# Patient Record
Sex: Female | Born: 1955
Health system: Southern US, Community
[De-identification: ages and names within clinical notes are randomized; demographics above are authoritative.]

## PROBLEM LIST (undated history)

## (undated) DIAGNOSIS — O3410 Maternal care for benign tumor of corpus uteri, unspecified trimester: Secondary | ICD-10-CM

## (undated) DIAGNOSIS — I10 Essential (primary) hypertension: Secondary | ICD-10-CM

## (undated) DIAGNOSIS — D259 Leiomyoma of uterus, unspecified: Secondary | ICD-10-CM

## (undated) DIAGNOSIS — D649 Anemia, unspecified: Secondary | ICD-10-CM

## (undated) DIAGNOSIS — E119 Type 2 diabetes mellitus without complications: Secondary | ICD-10-CM

## (undated) DIAGNOSIS — K219 Gastro-esophageal reflux disease without esophagitis: Secondary | ICD-10-CM

## (undated) DIAGNOSIS — K5792 Diverticulitis of intestine, part unspecified, without perforation or abscess without bleeding: Secondary | ICD-10-CM

## (undated) DIAGNOSIS — IMO0001 Reserved for inherently not codable concepts without codable children: Secondary | ICD-10-CM

## (undated) HISTORY — PX: HAMMER TOE SURGERY: SHX385

## (undated) HISTORY — PX: OTHER SURGICAL HISTORY: SHX169

## (undated) HISTORY — DX: Anemia, unspecified: D64.9

## (undated) HISTORY — PX: TONSILLECTOMY: SUR1361

---

## 2012-11-06 ENCOUNTER — Other Ambulatory Visit: Payer: Self-pay | Admitting: Family Medicine

## 2012-11-06 DIAGNOSIS — Z1231 Encounter for screening mammogram for malignant neoplasm of breast: Secondary | ICD-10-CM

## 2012-12-03 ENCOUNTER — Ambulatory Visit
Admission: RE | Admit: 2012-12-03 | Discharge: 2012-12-03 | Disposition: A | Discharge status: Home / Self Care | Payer: Federal, State, Local not specified - PPO | Source: Ambulatory Visit | Attending: Family Medicine | Admitting: Family Medicine

## 2012-12-03 DIAGNOSIS — Z1231 Encounter for screening mammogram for malignant neoplasm of breast: Secondary | ICD-10-CM

## 2012-12-10 ENCOUNTER — Other Ambulatory Visit: Payer: Self-pay | Admitting: Gastroenterology

## 2012-12-10 DIAGNOSIS — R1310 Dysphagia, unspecified: Secondary | ICD-10-CM

## 2012-12-17 ENCOUNTER — Other Ambulatory Visit: Payer: Federal, State, Local not specified - PPO

## 2012-12-24 ENCOUNTER — Ambulatory Visit
Admission: RE | Admit: 2012-12-24 | Discharge: 2012-12-24 | Disposition: A | Discharge status: Home / Self Care | Payer: Federal, State, Local not specified - PPO | Source: Ambulatory Visit | Attending: Gastroenterology | Admitting: Gastroenterology

## 2012-12-24 DIAGNOSIS — R1310 Dysphagia, unspecified: Secondary | ICD-10-CM

## 2013-02-09 ENCOUNTER — Encounter (HOSPITAL_BASED_OUTPATIENT_CLINIC_OR_DEPARTMENT_OTHER): Payer: Self-pay | Admitting: *Deleted

## 2013-02-09 ENCOUNTER — Emergency Department (HOSPITAL_BASED_OUTPATIENT_CLINIC_OR_DEPARTMENT_OTHER)
Admission: EM | Admit: 2013-02-09 | Discharge: 2013-02-09 | Disposition: A | Discharge status: Home / Self Care | Payer: Federal, State, Local not specified - PPO | Attending: Emergency Medicine | Admitting: Emergency Medicine

## 2013-02-09 DIAGNOSIS — Z79899 Other long term (current) drug therapy: Secondary | ICD-10-CM | POA: Insufficient documentation

## 2013-02-09 DIAGNOSIS — I10 Essential (primary) hypertension: Secondary | ICD-10-CM | POA: Insufficient documentation

## 2013-02-09 DIAGNOSIS — Z8711 Personal history of peptic ulcer disease: Secondary | ICD-10-CM | POA: Insufficient documentation

## 2013-02-09 DIAGNOSIS — Z8719 Personal history of other diseases of the digestive system: Secondary | ICD-10-CM | POA: Insufficient documentation

## 2013-02-09 DIAGNOSIS — M436 Torticollis: Secondary | ICD-10-CM | POA: Insufficient documentation

## 2013-02-09 DIAGNOSIS — M25519 Pain in unspecified shoulder: Secondary | ICD-10-CM | POA: Insufficient documentation

## 2013-02-09 DIAGNOSIS — E119 Type 2 diabetes mellitus without complications: Secondary | ICD-10-CM | POA: Insufficient documentation

## 2013-02-09 DIAGNOSIS — K219 Gastro-esophageal reflux disease without esophagitis: Secondary | ICD-10-CM | POA: Insufficient documentation

## 2013-02-09 DIAGNOSIS — R110 Nausea: Secondary | ICD-10-CM | POA: Insufficient documentation

## 2013-02-09 DIAGNOSIS — R109 Unspecified abdominal pain: Secondary | ICD-10-CM | POA: Insufficient documentation

## 2013-02-09 DIAGNOSIS — G44209 Tension-type headache, unspecified, not intractable: Secondary | ICD-10-CM | POA: Insufficient documentation

## 2013-02-09 HISTORY — DX: Gastro-esophageal reflux disease without esophagitis: K21.9

## 2013-02-09 HISTORY — DX: Type 2 diabetes mellitus without complications: E11.9

## 2013-02-09 HISTORY — DX: Essential (primary) hypertension: I10

## 2013-02-09 HISTORY — DX: Reserved for inherently not codable concepts without codable children: IMO0001

## 2013-02-09 HISTORY — DX: Diverticulitis of intestine, part unspecified, without perforation or abscess without bleeding: K57.92

## 2013-02-09 LAB — URINE MICROSCOPIC-ADD ON

## 2013-02-09 LAB — CBC WITH DIFFERENTIAL/PLATELET
Basophils Absolute: 0 10*3/uL (ref 0.0–0.1)
Basophils Relative: 0 % (ref 0–1)
Eosinophils Absolute: 0.1 10*3/uL (ref 0.0–0.7)
Eosinophils Relative: 1 % (ref 0–5)
HCT: 39.2 % (ref 36.0–46.0)
Hemoglobin: 13.6 g/dL (ref 12.0–15.0)
Lymphocytes Relative: 32 % (ref 12–46)
Lymphs Abs: 1.7 10*3/uL (ref 0.7–4.0)
MCH: 31.9 pg (ref 26.0–34.0)
MCHC: 34.7 g/dL (ref 30.0–36.0)
MCV: 92 fL (ref 78.0–100.0)
Monocytes Absolute: 0.3 10*3/uL (ref 0.1–1.0)
Monocytes Relative: 7 % (ref 3–12)
Neutro Abs: 3.1 10*3/uL (ref 1.7–7.7)
Neutrophils Relative %: 60 % (ref 43–77)
Platelets: 252 10*3/uL (ref 150–400)
RBC: 4.26 MIL/uL (ref 3.87–5.11)
RDW: 13.4 % (ref 11.5–15.5)
WBC: 5.2 10*3/uL (ref 4.0–10.5)

## 2013-02-09 LAB — COMPREHENSIVE METABOLIC PANEL
ALT: 20 U/L (ref 0–35)
AST: 23 U/L (ref 0–37)
Albumin: 4.2 g/dL (ref 3.5–5.2)
Alkaline Phosphatase: 101 U/L (ref 39–117)
BUN: 15 mg/dL (ref 6–23)
CO2: 27 mEq/L (ref 19–32)
Calcium: 10.8 mg/dL — ABNORMAL HIGH (ref 8.4–10.5)
Chloride: 100 mEq/L (ref 96–112)
Creatinine, Ser: 0.8 mg/dL (ref 0.50–1.10)
GFR calc Af Amer: >90 mL/min (ref >90)
GFR calc non Af Amer: 81 mL/min — ABNORMAL LOW (ref >90)
Glucose, Bld: 73 mg/dL (ref 70–99)
Potassium: 3.6 mEq/L (ref 3.5–5.1)
Sodium: 139 mEq/L (ref 135–145)
Total Bilirubin: 0.3 mg/dL (ref 0.3–1.2)
Total Protein: 8.3 g/dL (ref 6.0–8.3)

## 2013-02-09 LAB — URINALYSIS, ROUTINE W REFLEX MICROSCOPIC
Bilirubin Urine: NEGATIVE
Glucose, UA: NEGATIVE mg/dL
Ketones, ur: NEGATIVE mg/dL
Nitrite: NEGATIVE
Protein, ur: NEGATIVE mg/dL
Specific Gravity, Urine: 1.014 (ref 1.005–1.030)
Urobilinogen, UA: 0.2 mg/dL (ref 0.0–1.0)
pH: 6 (ref 5.0–8.0)

## 2013-02-09 LAB — GLUCOSE, CAPILLARY: Glucose-Capillary: 74 mg/dL (ref 70–99)

## 2013-02-09 MED ORDER — SODIUM CHLORIDE 0.9 % IV BOLUS (SEPSIS)
1000.0000 mL | Freq: Once | INTRAVENOUS | Status: AC
  Administered 2013-02-09: 1000 mL via INTRAVENOUS

## 2013-02-09 MED ORDER — ONDANSETRON HCL 4 MG/2ML IJ SOLN
4.0000 mg | Freq: Once | INTRAMUSCULAR | Status: AC
  Administered 2013-02-09: 4 mg via INTRAVENOUS
  Filled 2013-02-09: qty 2

## 2013-02-09 MED ORDER — KETOROLAC TROMETHAMINE 30 MG/ML IJ SOLN
30.0000 mg | Freq: Once | INTRAMUSCULAR | Status: AC
  Administered 2013-02-09: 30 mg via INTRAVENOUS
  Filled 2013-02-09: qty 1

## 2013-02-09 NOTE — ED Provider Notes (Signed)
History    This chart was scribed for Charles B. Bernette Mayers, MD by Quintella Reichert, ED scribe.  This patient was seen in room MH03/MH03 and the patient's care was started at 6:52 PM.   CSN: [id number]  Arrival date & time 02/09/13  1755      Chief Complaint  Patient presents with  . Headache     The history is provided by the patient. No language interpreter was used.    HPI Comments: Nichole Harris is a 57 y.o. female who presents to the Emergency Department complaining of moderate-onset abdominal discomfort that began yesterday afternoon, with accompanying gradual-onset, gradually-worsening moderate headache that began today.  Pt describes abdominal symptoms as nausea accompanied by a sensation of bloating and gassiness.  Symptoms are not affected by eating.  She denies emesis, fever, urinary symptoms, bowel symptoms, abdominal pain, appetite change or any other associated symptoms.  Pt reports h/o diverticulitis and stomach ulcers but reports that her present symptoms feel different.  She states she has been moving her bowels regularly.  Pt describes head pain as diffuse throughout her entire head.  She denies h/o regular headaches.     Past Medical History  Diagnosis Date  . Diabetes mellitus without complication   . Hypertension   . Reflux   . Diverticulitis     Past Surgical History  Procedure Laterality Date  . Ufe      History reviewed. No pertinent family history.  History  Substance Use Topics  . Smoking status: Never Smoker   . Smokeless tobacco: Not on file  . Alcohol Use: Yes    OB History   Grav Para Term Preterm Abortions TAB SAB Ect Mult Living                  Review of Systems A complete 10 system review of systems was obtained and all systems are negative except as noted in the HPI and PMH.    Allergies  Review of patient's allergies indicates no known allergies.  Home Medications   Current Outpatient Rx  Name  Route  Sig  Dispense  Refill   . OMEPRAZOLE PO   Oral   Take by mouth.           BP 166/97  Pulse 72  Temp(Src) 97.9 F (36.6 C) (Oral)  Resp 20  Ht 6' (1.829 m)  Wt 275 lb (124.739 kg)  BMI 37.29 kg/m2  SpO2 97%  Physical Exam  Nursing note and vitals reviewed. Constitutional: She is oriented to person, place, and time. She appears well-developed and well-nourished.  HENT:  Head: Normocephalic and atraumatic.  Eyes: EOM are normal. Pupils are equal, round, and reactive to light.  Neck: Normal range of motion. Neck supple.  Cardiovascular: Normal rate, normal heart sounds and intact distal pulses.   Pulmonary/Chest: Effort normal and breath sounds normal.  Abdominal: Bowel sounds are normal. She exhibits no distension. There is no tenderness.  Musculoskeletal: Normal range of motion. She exhibits tenderness (Cervical paraspinal and shoulder soft tissue tenderness). She exhibits no edema.  Neurological: She is alert and oriented to person, place, and time. She has normal strength. No cranial nerve deficit or sensory deficit.  Skin: Skin is warm and dry. No rash noted.  Psychiatric: She has a normal mood and affect.    ED Course  Procedures (including critical care time)  DIAGNOSTIC STUDIES: Oxygen Saturation is 97% on room air, normal by my interpretation.    COORDINATION OF CARE: 6:56 PM-Discussed  treatment plan which includes anti-emetics and labs with pt at bedside and pt agreed to plan.      Labs Reviewed  COMPREHENSIVE METABOLIC PANEL - Abnormal; Notable for the following:    Calcium 10.8 (*)    GFR calc non Af Amer 81 (*)    All other components within normal limits  URINALYSIS, ROUTINE W REFLEX MICROSCOPIC - Abnormal; Notable for the following:    Hgb urine dipstick MODERATE (*)    Leukocytes, UA TRACE (*)    All other components within normal limits  URINE MICROSCOPIC-ADD ON - Abnormal; Notable for the following:    Squamous Epithelial / LPF FEW (*)    Bacteria, UA MANY (*)    All  other components within normal limits  URINE CULTURE  GLUCOSE, CAPILLARY  CBC WITH DIFFERENTIAL   No results found.   1. Nausea   2. Tension headache       MDM  Pt feeling better, labs unremarkable. Headache and neck stiffness from tension headache, no concern for intracranial process such as meningitis or SAH. She has history of hiatal hernia and ?gastritis, followed by GI, already taking PPI. Advised follow up with GI if symptoms persist.       I personally performed the services described in this documentation, which was scribed in my presence. The recorded information has been reviewed and is accurate.     Charles B. Bernette Mayers, MD 02/09/13 2109

## 2013-02-09 NOTE — ED Notes (Signed)
Pt states she has been "feeling bad" since Friday. Started off with "gas" and abd discomfort, but today has H/A and nausea. UTI 2 weeks ago.

## 2013-02-11 LAB — URINE CULTURE: Colony Count: 75000

## 2013-11-15 ENCOUNTER — Other Ambulatory Visit: Payer: Self-pay | Admitting: Obstetrics and Gynecology

## 2013-11-15 DIAGNOSIS — Z1231 Encounter for screening mammogram for malignant neoplasm of breast: Secondary | ICD-10-CM

## 2013-12-04 ENCOUNTER — Ambulatory Visit: Payer: Federal, State, Local not specified - PPO

## 2013-12-09 ENCOUNTER — Other Ambulatory Visit: Payer: Self-pay | Admitting: Family Medicine

## 2013-12-09 ENCOUNTER — Ambulatory Visit
Admission: RE | Admit: 2013-12-09 | Discharge: 2013-12-09 | Disposition: A | Discharge status: Home / Self Care | Payer: Federal, State, Local not specified - PPO | Source: Ambulatory Visit | Attending: Obstetrics and Gynecology | Admitting: Obstetrics and Gynecology

## 2013-12-09 ENCOUNTER — Ambulatory Visit
Admission: RE | Admit: 2013-12-09 | Discharge: 2013-12-09 | Disposition: A | Discharge status: Home / Self Care | Payer: Federal, State, Local not specified - PPO | Source: Ambulatory Visit | Attending: Family Medicine | Admitting: Family Medicine

## 2013-12-09 DIAGNOSIS — M79602 Pain in left arm: Secondary | ICD-10-CM

## 2013-12-09 DIAGNOSIS — Z1231 Encounter for screening mammogram for malignant neoplasm of breast: Secondary | ICD-10-CM

## 2013-12-11 ENCOUNTER — Other Ambulatory Visit: Payer: Self-pay | Admitting: Family Medicine

## 2013-12-11 ENCOUNTER — Ambulatory Visit
Admission: RE | Admit: 2013-12-11 | Discharge: 2013-12-11 | Disposition: A | Discharge status: Home / Self Care | Payer: Federal, State, Local not specified - PPO | Source: Ambulatory Visit | Attending: Family Medicine | Admitting: Family Medicine

## 2013-12-11 DIAGNOSIS — R079 Chest pain, unspecified: Secondary | ICD-10-CM

## 2013-12-13 ENCOUNTER — Other Ambulatory Visit: Payer: Self-pay | Admitting: Family Medicine

## 2013-12-13 DIAGNOSIS — R209 Unspecified disturbances of skin sensation: Secondary | ICD-10-CM

## 2013-12-19 ENCOUNTER — Other Ambulatory Visit: Payer: Federal, State, Local not specified - PPO

## 2013-12-21 ENCOUNTER — Ambulatory Visit
Admission: RE | Admit: 2013-12-21 | Discharge: 2013-12-21 | Disposition: A | Discharge status: Home / Self Care | Payer: Federal, State, Local not specified - PPO | Source: Ambulatory Visit | Attending: Family Medicine | Admitting: Family Medicine

## 2013-12-21 DIAGNOSIS — R209 Unspecified disturbances of skin sensation: Secondary | ICD-10-CM

## 2014-01-01 ENCOUNTER — Encounter: Payer: Self-pay | Admitting: Cardiovascular Disease

## 2014-01-01 DIAGNOSIS — E119 Type 2 diabetes mellitus without complications: Secondary | ICD-10-CM | POA: Insufficient documentation

## 2014-01-01 DIAGNOSIS — K219 Gastro-esophageal reflux disease without esophagitis: Secondary | ICD-10-CM

## 2014-01-01 DIAGNOSIS — K5792 Diverticulitis of intestine, part unspecified, without perforation or abscess without bleeding: Secondary | ICD-10-CM | POA: Insufficient documentation

## 2014-01-01 DIAGNOSIS — IMO0001 Reserved for inherently not codable concepts without codable children: Secondary | ICD-10-CM | POA: Insufficient documentation

## 2014-01-01 DIAGNOSIS — I10 Essential (primary) hypertension: Secondary | ICD-10-CM | POA: Insufficient documentation

## 2014-01-03 ENCOUNTER — Encounter: Payer: Self-pay | Admitting: Cardiovascular Disease

## 2014-01-03 ENCOUNTER — Ambulatory Visit (INDEPENDENT_AMBULATORY_CARE_PROVIDER_SITE_OTHER): Payer: Federal, State, Local not specified - PPO | Admitting: Cardiovascular Disease

## 2014-01-03 VITALS — BP 153/83 | HR 84 | Ht 72.0 in | Wt 290.0 lb

## 2014-01-03 DIAGNOSIS — K219 Gastro-esophageal reflux disease without esophagitis: Secondary | ICD-10-CM

## 2014-01-03 DIAGNOSIS — Z01810 Encounter for preprocedural cardiovascular examination: Secondary | ICD-10-CM

## 2014-01-03 DIAGNOSIS — E119 Type 2 diabetes mellitus without complications: Secondary | ICD-10-CM

## 2014-01-03 DIAGNOSIS — I10 Essential (primary) hypertension: Secondary | ICD-10-CM

## 2014-01-03 DIAGNOSIS — IMO0001 Reserved for inherently not codable concepts without codable children: Secondary | ICD-10-CM

## 2014-01-03 NOTE — Patient Instructions (Signed)
Your physician recommends that you schedule a follow-up appointment in: AS NEEDED  Your physician recommends that you continue on your current medications as directed. Please refer to the Current Medication list given to you today.  

## 2014-01-03 NOTE — Assessment & Plan Note (Signed)
Proton pump inhibiter  May be partially responsible for atypical symptoms  Weight loss and low carb diet

## 2014-01-03 NOTE — Progress Notes (Signed)
Patient ID: Nichole Harris, female   DOB: 22-Jul-1956, 58 y.o.   MRN: [id number]   58 yo obese black female referred by Dr Doran Durand for preop clearance  She needs right hammer toe surgery  She had uncomplicated surgery on her left foot a bit over a year ago.  She is obese with diabetes  She has chronic atypical chest and arm pain.  Pain is fleeting, sharp and nonexertional.  She also has mild to moderate reflux.  Thinks her left arm goes numb  And is swollen compared to the right  Has had mamogram and it was normal.  She is sedentary  She has taken her DM a bit more seriously lately with A1c 6.4 back on metformin  Indicates having normal myovue 3 or so years ago.  No history of connective tissue disease or arthritis       ROS: Denies fever, malais, weight loss, blurry vision, decreased visual acuity, cough, sputum, SOB, hemoptysis, pleuritic pain, palpitaitons, heartburn, abdominal pain, melena, lower extremity edema, claudication, or rash.  All other systems reviewed and negative   General: Affect appropriate Healthy:  appears stated age 13: normal  Tremor in lower jaw at times  Neck supple with no adenopathy JVP normal no bruits no thyromegaly Lungs clear with no wheezing and good diaphragmatic motion Heart:  S1/S2 no murmur,rub, gallop or click PMI normal Abdomen: benighn, BS positve, no tenderness, no AAA no bruit.  No HSM or HJR Distal pulses intact with no bruits No edema Neuro non-focal Skin warm and dry No muscular weakness  Medications Current Outpatient Prescriptions  Medication Sig Dispense Refill  . Esomeprazole Magnesium (NEXIUM PO) Take by mouth daily.      . metFORMIN (GLUCOPHAGE) 500 MG tablet Take 500 mg by mouth daily with breakfast.       No current facility-administered medications for this visit.    Allergies Review of patient's allergies indicates no known allergies.  Family History: No family history on file.  Social History: History   Social  History  . Marital Status: Married    Spouse Name: N/A    Number of Children: N/A  . Years of Education: N/A   Occupational History  . Not on file.   Social History Main Topics  . Smoking status: Never Smoker   . Smokeless tobacco: Not on file  . Alcohol Use: Yes  . Drug Use: No  . Sexual Activity: Yes    Birth Control/ Protection: None   Other Topics Concern  . Not on file   Social History Narrative  . No narrative on file    Electrocardiogram:  SR rate 80 poor R wave progression from body habitus  Assessment and Plan

## 2014-01-03 NOTE — Assessment & Plan Note (Signed)
Well controlled.  Continue current medications and low sodium Dash type diet.    

## 2014-01-03 NOTE — Assessment & Plan Note (Signed)
Discussed low carb diet.  Target hemoglobin A1c is 6.5 or less.  Continue current medications.  

## 2014-01-03 NOTE — Assessment & Plan Note (Signed)
He pains are totally atypical and non cardiac  ECG is normal for body habitus  Has had normal stress test in past and uncomplicated foot surgery in past  No need for further testing Clear to have right hammer toe surgery

## 2014-02-03 ENCOUNTER — Ambulatory Visit: Payer: Federal, State, Local not specified - PPO | Admitting: Interventional Cardiology

## 2014-04-22 ENCOUNTER — Ambulatory Visit
Admission: RE | Admit: 2014-04-22 | Discharge: 2014-04-22 | Disposition: A | Discharge status: Home / Self Care | Payer: Federal, State, Local not specified - PPO | Source: Ambulatory Visit | Attending: Family Medicine | Admitting: Family Medicine

## 2014-04-22 ENCOUNTER — Other Ambulatory Visit: Payer: Self-pay | Admitting: Family Medicine

## 2014-04-22 DIAGNOSIS — M545 Low back pain, unspecified: Secondary | ICD-10-CM

## 2014-04-24 ENCOUNTER — Other Ambulatory Visit: Payer: Self-pay | Admitting: Family Medicine

## 2014-04-24 DIAGNOSIS — M5137 Other intervertebral disc degeneration, lumbosacral region: Secondary | ICD-10-CM

## 2014-05-03 ENCOUNTER — Ambulatory Visit
Admission: RE | Admit: 2014-05-03 | Discharge: 2014-05-03 | Disposition: A | Discharge status: Home / Self Care | Payer: Federal, State, Local not specified - PPO | Source: Ambulatory Visit | Attending: Family Medicine | Admitting: Family Medicine

## 2014-05-03 DIAGNOSIS — M5137 Other intervertebral disc degeneration, lumbosacral region: Secondary | ICD-10-CM

## 2014-10-29 ENCOUNTER — Ambulatory Visit: Payer: Self-pay | Admitting: Podiatry

## 2014-11-13 ENCOUNTER — Other Ambulatory Visit: Payer: Self-pay

## 2014-11-13 DIAGNOSIS — Z1231 Encounter for screening mammogram for malignant neoplasm of breast: Secondary | ICD-10-CM

## 2014-11-25 ENCOUNTER — Other Ambulatory Visit: Payer: Self-pay | Admitting: Nurse Practitioner

## 2014-11-25 ENCOUNTER — Other Ambulatory Visit (HOSPITAL_COMMUNITY)
Admission: RE | Admit: 2014-11-25 | Discharge: 2014-11-25 | Disposition: A | Discharge status: Home / Self Care | Payer: Federal, State, Local not specified - PPO | Source: Ambulatory Visit | Attending: Nurse Practitioner | Admitting: Nurse Practitioner

## 2014-11-25 DIAGNOSIS — Z1151 Encounter for screening for human papillomavirus (HPV): Secondary | ICD-10-CM | POA: Diagnosis present

## 2014-11-25 DIAGNOSIS — Z01419 Encounter for gynecological examination (general) (routine) without abnormal findings: Secondary | ICD-10-CM | POA: Insufficient documentation

## 2014-11-27 LAB — CYTOLOGY - PAP

## 2014-12-17 ENCOUNTER — Encounter (INDEPENDENT_AMBULATORY_CARE_PROVIDER_SITE_OTHER): Payer: Self-pay

## 2014-12-17 ENCOUNTER — Ambulatory Visit
Admission: RE | Admit: 2014-12-17 | Discharge: 2014-12-17 | Disposition: A | Discharge status: Home / Self Care | Payer: Federal, State, Local not specified - PPO | Source: Ambulatory Visit

## 2014-12-17 DIAGNOSIS — Z1231 Encounter for screening mammogram for malignant neoplasm of breast: Secondary | ICD-10-CM

## 2015-04-16 ENCOUNTER — Emergency Department (HOSPITAL_BASED_OUTPATIENT_CLINIC_OR_DEPARTMENT_OTHER)
Admission: EM | Admit: 2015-04-16 | Discharge: 2015-04-16 | Disposition: A | Discharge status: Home / Self Care | Payer: Federal, State, Local not specified - PPO | Attending: Emergency Medicine | Admitting: Emergency Medicine

## 2015-04-16 ENCOUNTER — Emergency Department (HOSPITAL_BASED_OUTPATIENT_CLINIC_OR_DEPARTMENT_OTHER): Payer: Federal, State, Local not specified - PPO

## 2015-04-16 ENCOUNTER — Other Ambulatory Visit: Payer: Self-pay

## 2015-04-16 ENCOUNTER — Encounter (HOSPITAL_BASED_OUTPATIENT_CLINIC_OR_DEPARTMENT_OTHER): Payer: Self-pay

## 2015-04-16 DIAGNOSIS — K219 Gastro-esophageal reflux disease without esophagitis: Secondary | ICD-10-CM | POA: Insufficient documentation

## 2015-04-16 DIAGNOSIS — R109 Unspecified abdominal pain: Secondary | ICD-10-CM | POA: Diagnosis not present

## 2015-04-16 DIAGNOSIS — M546 Pain in thoracic spine: Secondary | ICD-10-CM | POA: Diagnosis present

## 2015-04-16 DIAGNOSIS — I10 Essential (primary) hypertension: Secondary | ICD-10-CM | POA: Diagnosis not present

## 2015-04-16 DIAGNOSIS — R0789 Other chest pain: Secondary | ICD-10-CM | POA: Insufficient documentation

## 2015-04-16 DIAGNOSIS — E119 Type 2 diabetes mellitus without complications: Secondary | ICD-10-CM | POA: Diagnosis not present

## 2015-04-16 DIAGNOSIS — R197 Diarrhea, unspecified: Secondary | ICD-10-CM | POA: Insufficient documentation

## 2015-04-16 DIAGNOSIS — R110 Nausea: Secondary | ICD-10-CM | POA: Insufficient documentation

## 2015-04-16 DIAGNOSIS — Z79899 Other long term (current) drug therapy: Secondary | ICD-10-CM | POA: Insufficient documentation

## 2015-04-16 DIAGNOSIS — M62830 Muscle spasm of back: Secondary | ICD-10-CM

## 2015-04-16 HISTORY — DX: Leiomyoma of uterus, unspecified: D25.9

## 2015-04-16 HISTORY — DX: Maternal care for benign tumor of corpus uteri, unspecified trimester: O34.10

## 2015-04-16 LAB — CBC WITH DIFFERENTIAL/PLATELET
Basophils Absolute: 0 10*3/uL (ref 0.0–0.1)
Basophils Relative: 0 % (ref 0–1)
Eosinophils Absolute: 0 10*3/uL (ref 0.0–0.7)
Eosinophils Relative: 1 % (ref 0–5)
HCT: 39.7 % (ref 36.0–46.0)
Hemoglobin: 13.1 g/dL (ref 12.0–15.0)
Lymphocytes Relative: 39 % (ref 12–46)
Lymphs Abs: 1.3 10*3/uL (ref 0.7–4.0)
MCH: 30 pg (ref 26.0–34.0)
MCHC: 33 g/dL (ref 30.0–36.0)
MCV: 90.8 fL (ref 78.0–100.0)
Monocytes Absolute: 0.3 10*3/uL (ref 0.1–1.0)
Monocytes Relative: 8 % (ref 3–12)
Neutro Abs: 1.7 10*3/uL (ref 1.7–7.7)
Neutrophils Relative %: 52 % (ref 43–77)
Platelets: 264 10*3/uL (ref 150–400)
RBC: 4.37 MIL/uL (ref 3.87–5.11)
RDW: 14.4 % (ref 11.5–15.5)
WBC: 3.4 10*3/uL — ABNORMAL LOW (ref 4.0–10.5)

## 2015-04-16 LAB — URINALYSIS, DIPSTICK ONLY
Bilirubin Urine: NEGATIVE
Glucose, UA: NEGATIVE mg/dL
Hgb urine dipstick: NEGATIVE
Ketones, ur: NEGATIVE mg/dL
Leukocytes, UA: NEGATIVE
Nitrite: NEGATIVE
Protein, ur: NEGATIVE mg/dL
Specific Gravity, Urine: 1.011 (ref 1.005–1.030)
Urobilinogen, UA: 0.2 mg/dL (ref 0.0–1.0)
pH: 6 (ref 5.0–8.0)

## 2015-04-16 LAB — COMPREHENSIVE METABOLIC PANEL
ALT: 14 U/L (ref 14–54)
AST: 19 U/L (ref 15–41)
Albumin: 3.9 g/dL (ref 3.5–5.0)
Alkaline Phosphatase: 98 U/L (ref 38–126)
Anion gap: 8 (ref 5–15)
BUN: 9 mg/dL (ref 6–20)
CO2: 27 mmol/L (ref 22–32)
Calcium: 10 mg/dL (ref 8.9–10.3)
Chloride: 104 mmol/L (ref 101–111)
Creatinine, Ser: 0.69 mg/dL (ref 0.44–1.00)
GFR calc Af Amer: >60 mL/min (ref >60)
GFR calc non Af Amer: >60 mL/min (ref >60)
Glucose, Bld: 111 mg/dL — ABNORMAL HIGH (ref 65–99)
Potassium: 3.9 mmol/L (ref 3.5–5.1)
Sodium: 139 mmol/L (ref 135–145)
Total Bilirubin: 0.5 mg/dL (ref 0.3–1.2)
Total Protein: 7.6 g/dL (ref 6.5–8.1)

## 2015-04-16 LAB — TROPONIN I: Troponin I: <0.03 ng/mL (ref <0.031)

## 2015-04-16 MED ORDER — OXYCODONE-ACETAMINOPHEN 10-325 MG PO TABS: 1.0000 | ORAL_TABLET | ORAL | Status: DC | PRN

## 2015-04-16 MED ORDER — MORPHINE SULFATE 4 MG/ML IJ SOLN
4.0000 mg | Freq: Once | INTRAMUSCULAR | Status: AC
  Administered 2015-04-16: 4 mg via INTRAVENOUS
  Filled 2015-04-16: qty 1

## 2015-04-16 MED ORDER — CYCLOBENZAPRINE HCL 10 MG PO TABS
5.0000 mg | ORAL_TABLET | Freq: Once | ORAL | Status: AC
  Administered 2015-04-16: 5 mg via ORAL
  Filled 2015-04-16: qty 1

## 2015-04-16 MED ORDER — ONDANSETRON HCL 4 MG/2ML IJ SOLN
4.0000 mg | Freq: Once | INTRAMUSCULAR | Status: AC
Start: 2015-04-16 — End: 2015-04-16
  Administered 2015-04-16: 4 mg via INTRAVENOUS
  Filled 2015-04-16: qty 2

## 2015-04-16 MED ORDER — CYCLOBENZAPRINE HCL 10 MG PO TABS: 10.0000 mg | ORAL_TABLET | Freq: Three times a day (TID) | ORAL | Status: DC | PRN

## 2015-04-16 NOTE — ED Notes (Signed)
Back pain that started yesterday.  Denies injury.  States she has experienced similar pain x 4 in the past. Pain worse this am with movement and palpation left flank area.

## 2015-04-16 NOTE — ED Notes (Signed)
MD at bedside. 

## 2015-04-16 NOTE — ED Provider Notes (Signed)
CSN: [id number]     Arrival date & time 04/16/15  0654 History   First MD Initiated Contact with Patient 04/16/15 [phone]     Chief Complaint  Patient presents with  . Back Pain     (Consider location/radiation/quality/duration/timing/severity/associated sxs/prior Treatment) HPI Comments: Pt. Is a 59 y/o AAF with hx of DMII, and Untreated HTN as well as Diverticulitis presenting today with acute onset low thoracic / upper lumbar back pain. This started early yesterday evening when she was sitting in her living room watching television. She says that the pain came on suddenly and radiates around her left side. The pain is better with favoring her right side while seated. Pain is severe. She has some nausea due to the pain. She has not had any numbness, tingling, bowel or bladder incontinence. She says she has not been lifting anything heavy recently, nor doing any new exercises / activities. She denies fevers, chills, recent illnesses, hematuria, dysuria. She has not had any constipation. She had one loose stool this am. She has not had any rashes. She has not been around anyone who has been sick. She has no had SOB, neck pain, jaw pain, arm pain. She HAS had this before over a year ago, but she says it resolved on its own without any intervention. This time the pain is worse, so she came in for evaluation.   The history is provided by the patient.    Past Medical History  Diagnosis Date  . Diabetes mellitus without complication   . Hypertension   . Reflux   . Diverticulitis   . Uterine fibroids affecting pregnancy    Past Surgical History  Procedure Laterality Date  . Ufe    . Hammer toe surgery    . Tonsillectomy    . Lumpectomy     No family history on file. History  Substance Use Topics  . Smoking status: Never Smoker   . Smokeless tobacco: Not on file  . Alcohol Use: Yes     Comment: opnce a month wine   OB History    No data available     Review of Systems    Constitutional: Positive for activity change. Negative for fever, chills, diaphoresis, appetite change, fatigue and unexpected weight change.  HENT: Negative.  Negative for congestion, rhinorrhea, sinus pressure and sore throat.   Eyes: Negative.  Negative for photophobia and visual disturbance.  Respiratory: Negative for cough, chest tightness, shortness of breath and wheezing.        Pleuritic pain  Cardiovascular: Negative for chest pain, palpitations and leg swelling.  Gastrointestinal: Positive for nausea and diarrhea. Negative for vomiting, abdominal pain, constipation, blood in stool and abdominal distention.  Endocrine: Negative.  Negative for heat intolerance, polydipsia and polyphagia.  Genitourinary: Positive for flank pain. Negative for dysuria, urgency, frequency, hematuria, decreased urine volume, difficulty urinating and pelvic pain.  Musculoskeletal: Positive for myalgias and back pain. Negative for joint swelling, arthralgias, gait problem, neck pain and neck stiffness.  Skin: Negative.  Negative for rash.  Allergic/Immunologic: Negative.   Neurological: Negative.  Negative for dizziness, syncope, speech difficulty, weakness, light-headedness, numbness and headaches.  Hematological: Negative.   Psychiatric/Behavioral: Negative.       Allergies  Review of patient's allergies indicates no known allergies.  Home Medications   Prior to Admission medications   Medication Sig Start Date End Date Taking? Authorizing Provider  cyclobenzaprine (FLEXERIL) 10 MG tablet Take 1 tablet (10 mg total) by mouth 3 (three) times daily  as needed for muscle spasms. 04/16/15   York Ram Gaje Tennyson, MD  Esomeprazole Magnesium (NEXIUM PO) Take by mouth daily.    Historical Provider, MD  metFORMIN (GLUCOPHAGE) 500 MG tablet Take 500 mg by mouth daily with breakfast.    Historical Provider, MD  oxyCODONE-acetaminophen (PERCOCET) 10-325 MG per tablet Take 1 tablet by mouth every 4 (four) hours as  needed for pain. 04/16/15   York Ram Emalene Welte, MD   BP 163/107 mmHg  Pulse 82  Temp(Src) 98 F (36.7 C) (Oral)  Resp 20  Ht 6' (1.829 m)  Wt 280 lb (127.007 kg)  BMI 37.97 kg/m2  SpO2 99% Physical Exam  Constitutional: She is oriented to person, place, and time. She appears well-developed and well-nourished. She appears distressed.  HENT:  Head: Normocephalic and atraumatic.  Eyes: Conjunctivae and EOM are normal. Pupils are equal, round, and reactive to light.  Neck: Normal range of motion. Neck supple.  Cardiovascular: Normal rate, regular rhythm, normal heart sounds and intact distal pulses.  Exam reveals no gallop and no friction rub.   No murmur heard. Pulmonary/Chest: Effort normal and breath sounds normal. No respiratory distress. She has no wheezes. She has no rales. She exhibits no tenderness.  Abdominal: Soft. Bowel sounds are normal. She exhibits no distension and no mass. There is no hepatosplenomegaly. There is no tenderness. There is no rigidity, no rebound, no guarding and no CVA tenderness.  Musculoskeletal: Normal range of motion. She exhibits tenderness. She exhibits no edema.       Thoracic back: She exhibits tenderness and pain. She exhibits no bony tenderness, no swelling, no edema, no deformity, no laceration and no spasm.       Back:  Lymphadenopathy:    She has no cervical adenopathy.  Neurological: She is alert and oriented to person, place, and time. No cranial nerve deficit. She exhibits normal muscle tone. Coordination normal.  Skin: Skin is warm and dry. No rash noted. She is not diaphoretic. No erythema.  Psychiatric: She has a normal mood and affect. Her behavior is normal.    ED Course  Procedures (including critical care time) Labs Review Labs Reviewed  COMPREHENSIVE METABOLIC PANEL - Abnormal; Notable for the following:    Glucose, Bld 111 (*)    All other components within normal limits  CBC WITH DIFFERENTIAL/PLATELET - Abnormal; Notable for the  following:    WBC 3.4 (*)    All other components within normal limits  TROPONIN I  URINALYSIS, DIPSTICK ONLY    Imaging Review Dg Chest 2 View  04/16/2015   CLINICAL DATA:  Left-sided posterior and axillary lower chest wall pain, history of diabetes and reflux, nonsmoker.  EXAM: CHEST  2 VIEW  COMPARISON:  PA and lateral chest x-ray of December 11, 2013  FINDINGS: The lungs are well-expanded and clear. The heart and mediastinal structures are normal. The trachea is midline. There is no pleural effusion. There is mild tortuosity of the descending thoracic aorta. The bony thorax exhibits no acute abnormality.  IMPRESSION: There is no active cardiopulmonary disease.   Electronically Signed   By: David  Martinique M.D.   On: 04/16/2015 08:42     EKG Interpretation   Date/Time:  Thursday April 16 2015 07:28:30 EDT Ventricular Rate:  68 PR Interval:  160 QRS Duration: 76 QT Interval:  378 QTC Calculation: 401 R Axis:   37 Text Interpretation:  Normal sinus rhythm Normal ECG Confirmed by KOHUT   MD, STEPHEN ([id number]) on 04/16/2015 7:44:22 AM  MDM   Final diagnoses:  Muscle spasm of back   59 y/o AAF with DMII, and HTN here with acute onset mid - low back pain. Differential includes muscle strain / spasm, kidney stone, pyelonephritis, diverticulitis, Pneumonia. Less likely, though possible would be PE (Well's equivocal) or MI. At this point will get basic lab work and U/A and proceed with further workup from there. Morphine and Flexeril for pain and muscle relaxation.   8:55 am: Workup Negative. Afebrile and without WBC elevation. U/A completely negative, and CXR without evidence of pneumonia. Vital signs stable. Pain improved with morphine and flexeril. Likely musculoskeletal at this point. Will discharge home with plan for close follow up with PCP. Return precautions given. Safe for discharge.      Aquilla Hacker, MD 04/16/15 [id number]  Virgel Manifold, MD 04/17/15 [phone]

## 2015-04-16 NOTE — ED Notes (Signed)
Pt returned from radiology.  No change in assessment.

## 2015-04-16 NOTE — Discharge Instructions (Signed)

## 2015-04-16 NOTE — ED Notes (Signed)
Pt calling ride (co-worker) for transport.  Discussing no driving vehicle considering pt received IV narcotics.  Pt verbalizes understanding and agrees to have someone drive her home.

## 2015-08-24 ENCOUNTER — Other Ambulatory Visit: Payer: Self-pay | Admitting: Gastroenterology

## 2015-08-24 DIAGNOSIS — R1310 Dysphagia, unspecified: Secondary | ICD-10-CM

## 2015-08-28 ENCOUNTER — Ambulatory Visit
Admission: RE | Admit: 2015-08-28 | Discharge: 2015-08-28 | Disposition: A | Discharge status: Home / Self Care | Payer: Federal, State, Local not specified - PPO | Source: Ambulatory Visit | Attending: Gastroenterology | Admitting: Gastroenterology

## 2015-08-28 DIAGNOSIS — R1310 Dysphagia, unspecified: Secondary | ICD-10-CM

## 2015-11-19 ENCOUNTER — Other Ambulatory Visit: Payer: Self-pay

## 2015-11-19 DIAGNOSIS — Z1231 Encounter for screening mammogram for malignant neoplasm of breast: Secondary | ICD-10-CM

## 2015-12-25 ENCOUNTER — Ambulatory Visit
Admission: RE | Admit: 2015-12-25 | Discharge: 2015-12-25 | Disposition: A | Discharge status: Home / Self Care | Payer: Federal, State, Local not specified - PPO | Source: Ambulatory Visit

## 2015-12-25 DIAGNOSIS — Z1231 Encounter for screening mammogram for malignant neoplasm of breast: Secondary | ICD-10-CM

## 2016-04-14 ENCOUNTER — Other Ambulatory Visit: Payer: Self-pay | Admitting: Surgery

## 2016-04-14 DIAGNOSIS — R252 Cramp and spasm: Secondary | ICD-10-CM

## 2016-05-16 ENCOUNTER — Encounter: Payer: Self-pay | Admitting: Surgery

## 2016-05-18 ENCOUNTER — Encounter: Payer: Self-pay | Admitting: Surgery

## 2016-05-18 ENCOUNTER — Ambulatory Visit (HOSPITAL_COMMUNITY)
Admission: RE | Admit: 2016-05-18 | Discharge: 2016-05-18 | Disposition: A | Discharge status: Home / Self Care | Payer: Federal, State, Local not specified - PPO | Source: Ambulatory Visit | Attending: Surgery | Admitting: Surgery

## 2016-05-18 ENCOUNTER — Ambulatory Visit (INDEPENDENT_AMBULATORY_CARE_PROVIDER_SITE_OTHER): Payer: Federal, State, Local not specified - PPO | Admitting: Surgery

## 2016-05-18 VITALS — BP 134/88 | HR 74 | Temp 97.1°F | Resp 18 | Ht 71.5 in | Wt 296.7 lb

## 2016-05-18 DIAGNOSIS — I10 Essential (primary) hypertension: Secondary | ICD-10-CM | POA: Insufficient documentation

## 2016-05-18 DIAGNOSIS — G8929 Other chronic pain: Secondary | ICD-10-CM

## 2016-05-18 DIAGNOSIS — K219 Gastro-esophageal reflux disease without esophagitis: Secondary | ICD-10-CM | POA: Diagnosis not present

## 2016-05-18 DIAGNOSIS — E119 Type 2 diabetes mellitus without complications: Secondary | ICD-10-CM | POA: Diagnosis not present

## 2016-05-18 DIAGNOSIS — R252 Cramp and spasm: Secondary | ICD-10-CM | POA: Insufficient documentation

## 2016-05-18 DIAGNOSIS — R0989 Other specified symptoms and signs involving the circulatory and respiratory systems: Secondary | ICD-10-CM | POA: Insufficient documentation

## 2016-05-18 DIAGNOSIS — M79606 Pain in leg, unspecified: Secondary | ICD-10-CM | POA: Diagnosis not present

## 2016-05-18 NOTE — Progress Notes (Signed)
[facility]  Patient name: Nichole Harris MRN: [id number] DOB: [birth year] Sex: female  REFERRING PHYSICIAN: Dr. Chapman Fitch  REASON FOR CONSULT: Leg cramps and sharpness, numbness  HPI: Nichole Harris is a 60 y.o. female, who is referred today for evaluation of leg pain.  The patient states that she has been having leg cramps, pain and numbness that has been getting progressively worse.  She states that she gets cramps in the middle the night and while she is sitting down.  She does not endorse cramping with activity.  She states that her mother and sister also have cramping.  She has been trying magnesium as of late and this has improved her symptoms somewhat.  She does not have any open wounds on her legs.  The patient is diabetic which has been controlled.  She is medically managed for hypertension.  She is a nonsmoker  Past Medical History:  Diagnosis Date  . Anemia   . Diabetes mellitus without complication ([place])   . Diverticulitis   . Hypertension   . Reflux   . Uterine fibroids affecting pregnancy     Family History  Problem Relation Age of Onset  . AAA (abdominal aortic aneurysm) Mother   . AAA (abdominal aortic aneurysm) Father   . Heart disease Father     SOCIAL HISTORY: Social History   Social History  . Marital status: Married    Spouse name: N/A  . Number of children: N/A  . Years of education: N/A   Occupational History  . Not on file.   Social History Main Topics  . Smoking status: Never Smoker  . Smokeless tobacco: Never Used  . Alcohol use Yes     Comment: opnce a month wine  . Drug use: No  . Sexual activity: Yes    Birth control/ protection: None   Other Topics Concern  . Not on file   Social History Narrative  . No narrative on file    No Known Allergies  Current Outpatient Prescriptions  Medication Sig Dispense Refill  . Esomeprazole Magnesium (NEXIUM PO) Take by mouth daily.     . Magnesium 250 MG TABS Take by mouth daily.    . metFORMIN (GLUCOPHAGE) 500 MG tablet Take 500 mg by mouth daily with breakfast.    . Multiple Vitamins-Minerals (MULTIVITAMIN ADULT PO) Take by mouth.    . cyclobenzaprine (FLEXERIL) 10 MG tablet Take 1 tablet (10 mg total) by mouth 3 (three) times daily as needed for muscle spasms. (Patient not taking: Reported on 05/18/2016) 30 tablet 0  . oxyCODONE-acetaminophen (PERCOCET) 10-325 MG per tablet Take 1 tablet by mouth every 4 (four) hours as needed for pain. (Patient not taking: Reported on 05/18/2016) 15 tablet 0   No current facility-administered medications for this visit.     REVIEW OF SYSTEMS:  [X]  denotes positive finding, [ ]  denotes negative finding Cardiac  Comments:  Chest pain or chest pressure:    Shortness of breath upon exertion:    Short of breath when lying flat:    Irregular heart rhythm:        Vascular    Pain in calf, thigh, or hip brought on by ambulation: x   Pain in feet at night that wakes you up from your sleep:  x   Blood clot in your veins:    Leg swelling:  x       Pulmonary    Oxygen at home:    Productive cough:  Wheezing:         Neurologic    Sudden weakness in arms or legs:  x   Sudden numbness in arms or legs:  x   Sudden onset of difficulty speaking or slurred speech:    Temporary loss of vision in one eye:     Problems with dizziness:         Gastrointestinal    Blood in stool:     Vomited blood:         Genitourinary    Burning when urinating:     Blood in urine:        Psychiatric    Major depression:         Hematologic    Bleeding problems:    Problems with blood clotting too easily:        Skin    Rashes or ulcers:        Constitutional    Fever or chills:      PHYSICAL EXAM: Vitals:   05/18/16 1226  BP: 134/88  Pulse: 74  Resp: 18  Temp: 97.1 F (36.2 C)  TempSrc: Oral  SpO2: 100%  Weight: 296 lb 11.2 oz (134.6 kg)  Height: 5' 11.5" (1.816 m)     GENERAL: The patient is a well-nourished female, in no acute distress. The vital signs are documented above. CARDIAC: There is a regular rate and rhythm.  VASCULAR: palpable pedal pulses PULMONARY: There is good air exchange bilaterally without wheezing or rales. MUSCULOSKELETAL: There are no major deformities or cyanosis. NEUROLOGIC: No focal weakness or paresthesias are detected. SKIN: There are no ulcers or rashes noted. PSYCHIATRIC: The patient has a normal affect.  DATA:  I have reviewed the patient's vascular lab studies.  ABIs 1.1 on the right and 1.07 left  ASSESSMENT AND PLAN: Leg cramps: I do not think that the patient is suffering from vascular insufficiency.  Despite being a diabetic, she has normal arterial Doppler studies down to the ankle.  Her cramping is most likely secondary to electrolyte imbalance and/or possibly dehydration.  I have encouraged her to stay well-hydrated and didn't improve for nutrition and dietary supplementation.  She'll follow-up with me on an as-needed basis.   Annamarie Major, MD [facility] [phone] Pager [phone]

## 2016-07-11 DIAGNOSIS — Z79899 Other long term (current) drug therapy: Secondary | ICD-10-CM | POA: Diagnosis not present

## 2016-07-14 ENCOUNTER — Other Ambulatory Visit: Payer: Self-pay | Admitting: Nurse Practitioner

## 2016-07-14 DIAGNOSIS — N898 Other specified noninflammatory disorders of vagina: Secondary | ICD-10-CM | POA: Diagnosis not present

## 2016-07-14 DIAGNOSIS — K080 Exfoliation of teeth due to systemic causes: Secondary | ICD-10-CM | POA: Diagnosis not present

## 2016-07-14 DIAGNOSIS — N644 Mastodynia: Secondary | ICD-10-CM

## 2016-07-20 ENCOUNTER — Ambulatory Visit
Admission: RE | Admit: 2016-07-20 | Discharge: 2016-07-20 | Disposition: A | Discharge status: Home / Self Care | Payer: Federal, State, Local not specified - PPO | Source: Ambulatory Visit | Attending: Nurse Practitioner | Admitting: Nurse Practitioner

## 2016-07-20 DIAGNOSIS — N644 Mastodynia: Secondary | ICD-10-CM

## 2016-07-20 IMAGING — CR DG CHEST 2V
2 series · 2 of 2 positions shown · non-contrast
Comparison: PA and lateral chest x-ray December 11, 2013

CLINICAL DATA: Left-sided posterior and axillary lower chest wall
pain, history of diabetes and reflux, nonsmoker.

EXAM:
CHEST  2 VIEW

[w chest pa]
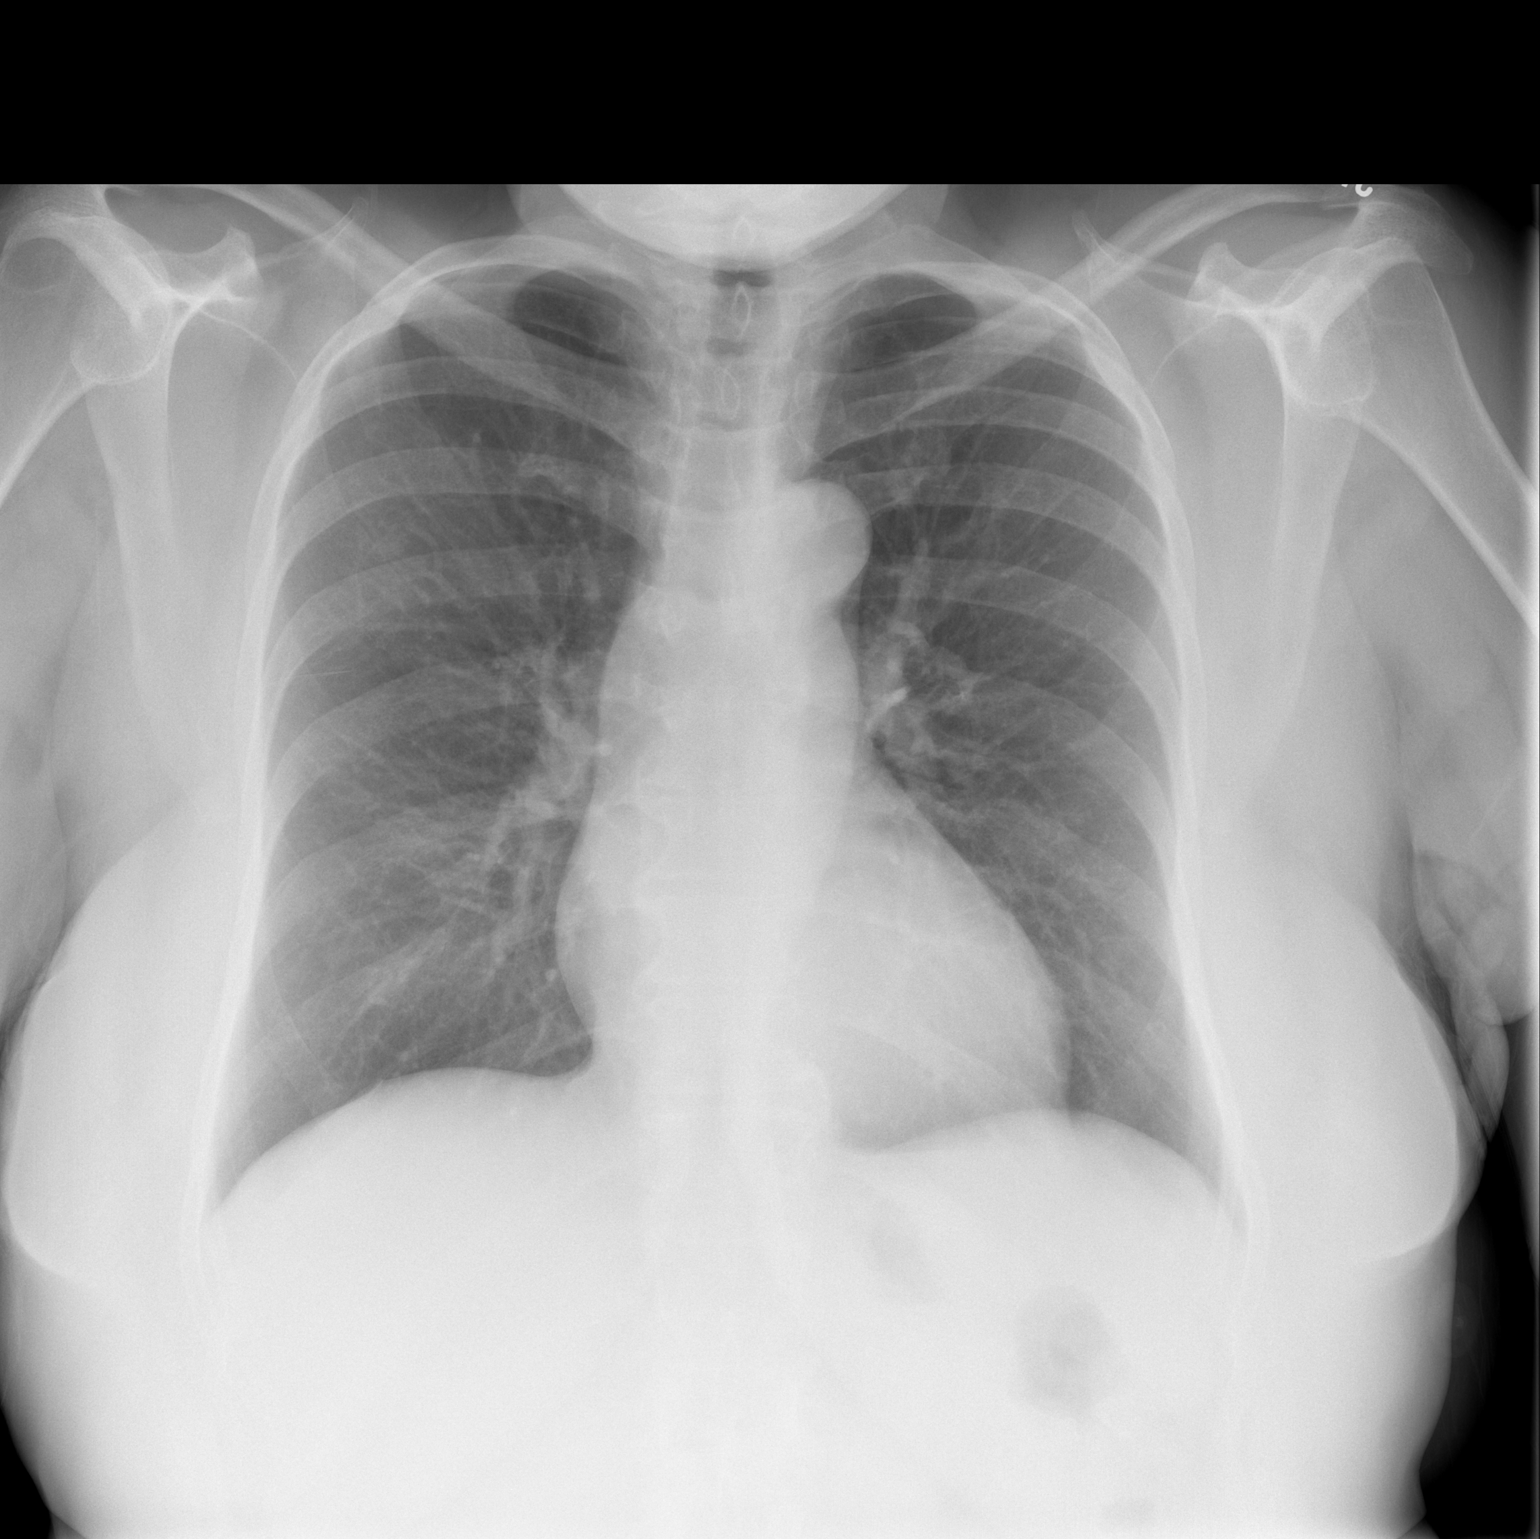

[w chest lat]
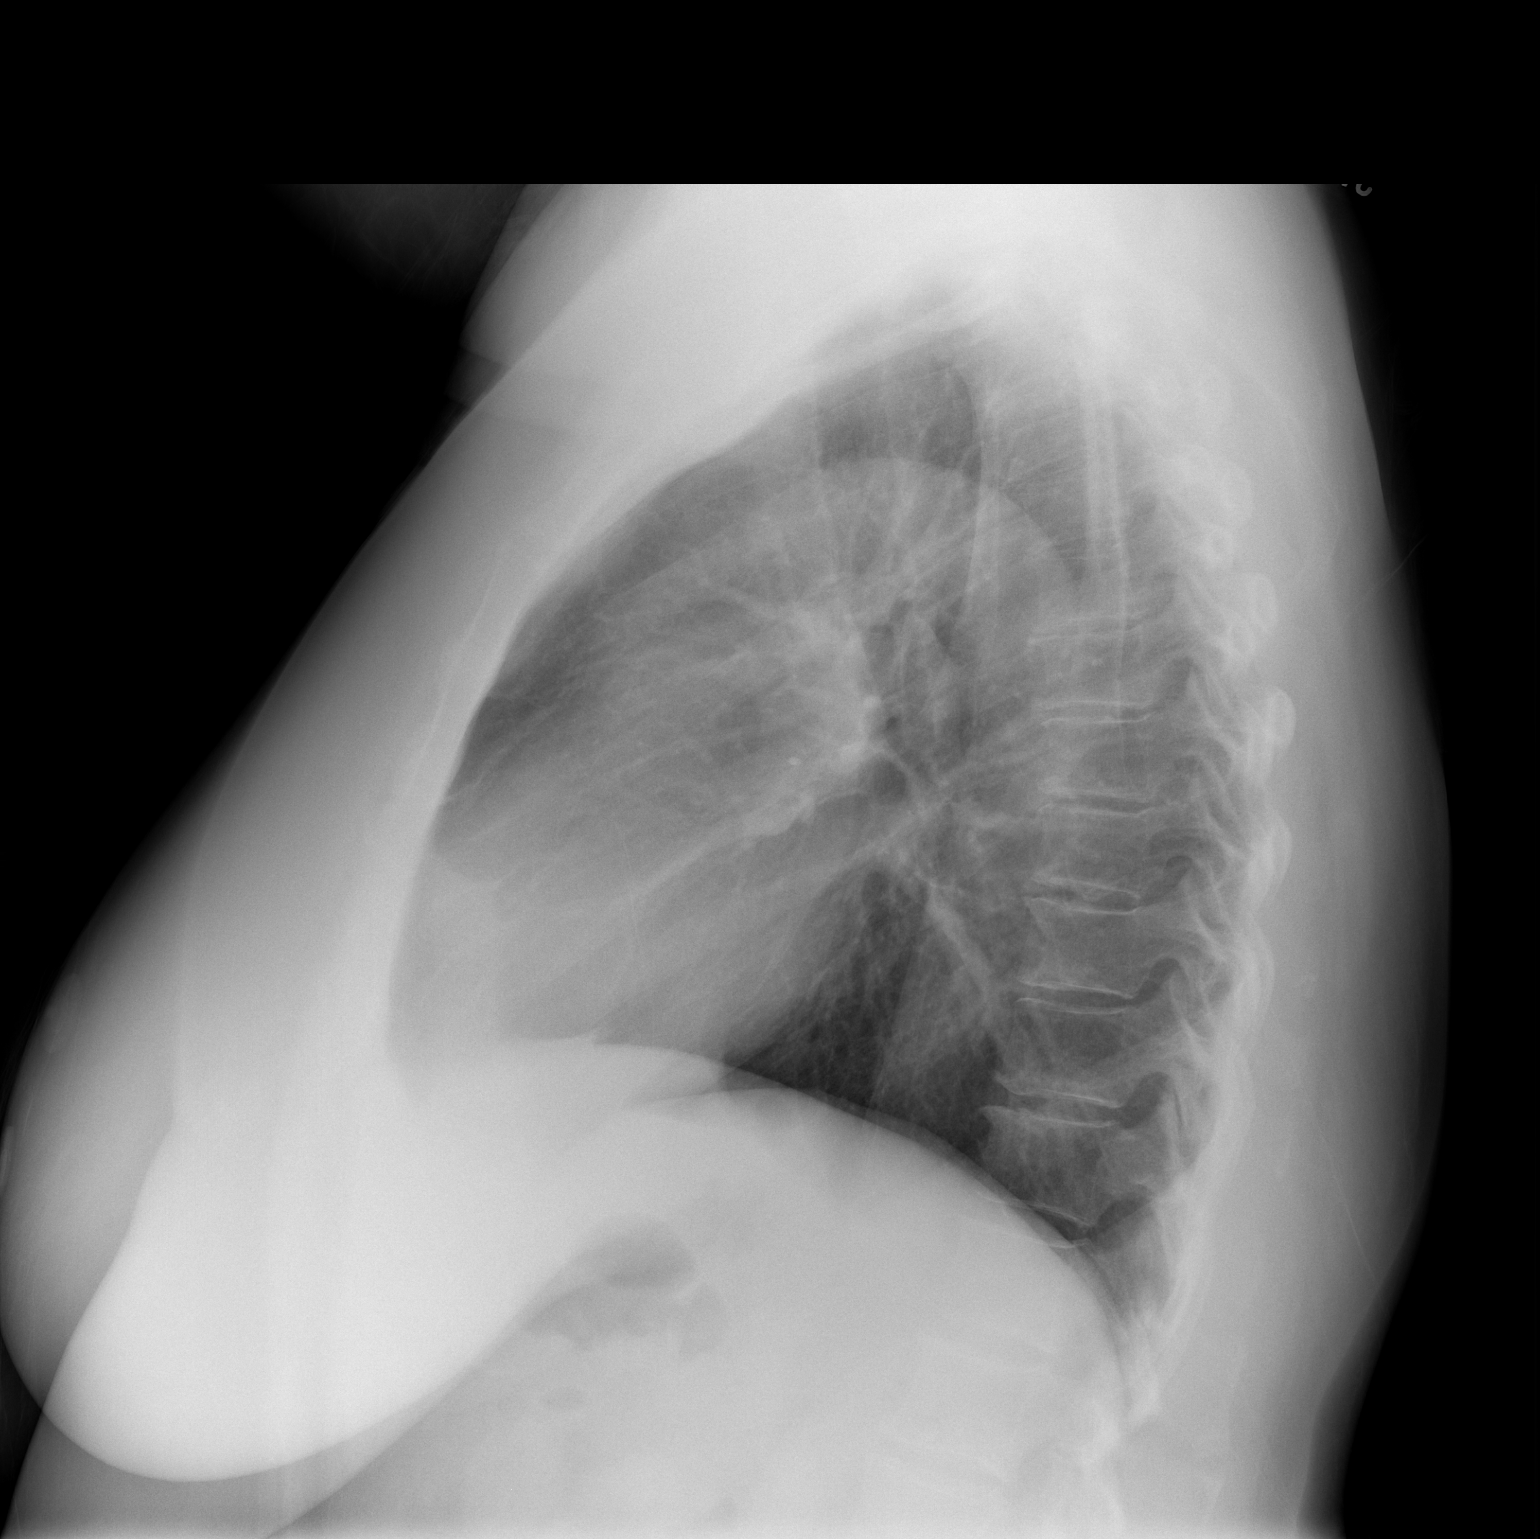

[2 of 2 positions shown; findings below may reference images not displayed]

FINDINGS: The lungs are well-expanded and clear. The heart and mediastinal
structures are normal. The trachea is midline. There is no pleural
effusion. There is mild tortuosity of the descending thoracic aorta.
The bony thorax exhibits no acute abnormality.
IMPRESSION: There is no active cardiopulmonary disease.

## 2016-08-12 DIAGNOSIS — Z83511 Family history of glaucoma: Secondary | ICD-10-CM | POA: Diagnosis not present

## 2016-08-12 DIAGNOSIS — E119 Type 2 diabetes mellitus without complications: Secondary | ICD-10-CM | POA: Diagnosis not present

## 2016-08-12 DIAGNOSIS — H40053 Ocular hypertension, bilateral: Secondary | ICD-10-CM | POA: Diagnosis not present

## 2016-08-12 DIAGNOSIS — H40013 Open angle with borderline findings, low risk, bilateral: Secondary | ICD-10-CM | POA: Diagnosis not present

## 2016-09-12 DIAGNOSIS — E119 Type 2 diabetes mellitus without complications: Secondary | ICD-10-CM | POA: Diagnosis not present

## 2016-09-12 DIAGNOSIS — E785 Hyperlipidemia, unspecified: Secondary | ICD-10-CM | POA: Diagnosis not present

## 2016-09-12 DIAGNOSIS — E1165 Type 2 diabetes mellitus with hyperglycemia: Secondary | ICD-10-CM | POA: Diagnosis not present

## 2016-09-12 DIAGNOSIS — Z79899 Other long term (current) drug therapy: Secondary | ICD-10-CM | POA: Diagnosis not present

## 2016-10-19 ENCOUNTER — Emergency Department (HOSPITAL_BASED_OUTPATIENT_CLINIC_OR_DEPARTMENT_OTHER)
Admission: EM | Admit: 2016-10-19 | Discharge: 2016-10-19 | Disposition: A | Discharge status: Home / Self Care | Payer: Federal, State, Local not specified - PPO | Attending: Emergency Medicine | Admitting: Emergency Medicine

## 2016-10-19 ENCOUNTER — Emergency Department (HOSPITAL_BASED_OUTPATIENT_CLINIC_OR_DEPARTMENT_OTHER): Payer: Federal, State, Local not specified - PPO

## 2016-10-19 ENCOUNTER — Encounter (HOSPITAL_BASED_OUTPATIENT_CLINIC_OR_DEPARTMENT_OTHER): Payer: Self-pay

## 2016-10-19 DIAGNOSIS — G44209 Tension-type headache, unspecified, not intractable: Secondary | ICD-10-CM | POA: Diagnosis not present

## 2016-10-19 DIAGNOSIS — R51 Headache: Secondary | ICD-10-CM | POA: Diagnosis not present

## 2016-10-19 DIAGNOSIS — Y92410 Unspecified street and highway as the place of occurrence of the external cause: Secondary | ICD-10-CM | POA: Diagnosis not present

## 2016-10-19 DIAGNOSIS — Z79899 Other long term (current) drug therapy: Secondary | ICD-10-CM | POA: Diagnosis not present

## 2016-10-19 DIAGNOSIS — G44311 Acute post-traumatic headache, intractable: Secondary | ICD-10-CM | POA: Diagnosis not present

## 2016-10-19 DIAGNOSIS — Y999 Unspecified external cause status: Secondary | ICD-10-CM | POA: Insufficient documentation

## 2016-10-19 DIAGNOSIS — S0990XA Unspecified injury of head, initial encounter: Secondary | ICD-10-CM | POA: Diagnosis present

## 2016-10-19 DIAGNOSIS — Y9389 Activity, other specified: Secondary | ICD-10-CM | POA: Diagnosis not present

## 2016-10-19 DIAGNOSIS — I10 Essential (primary) hypertension: Secondary | ICD-10-CM | POA: Diagnosis not present

## 2016-10-19 DIAGNOSIS — Z7984 Long term (current) use of oral hypoglycemic drugs: Secondary | ICD-10-CM | POA: Insufficient documentation

## 2016-10-19 DIAGNOSIS — E119 Type 2 diabetes mellitus without complications: Secondary | ICD-10-CM | POA: Diagnosis not present
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
## 2016-10-19 NOTE — ED Notes (Signed)
Patient transported to CT 
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
## 2016-10-19 NOTE — Discharge Instructions (Signed)
See your Physician for recheck.  °

## 2016-11-21 DIAGNOSIS — Z79899 Other long term (current) drug therapy: Secondary | ICD-10-CM | POA: Diagnosis not present

## 2016-11-21 DIAGNOSIS — E119 Type 2 diabetes mellitus without complications: Secondary | ICD-10-CM | POA: Diagnosis not present

## 2016-11-21 DIAGNOSIS — E785 Hyperlipidemia, unspecified: Secondary | ICD-10-CM | POA: Diagnosis not present

## 2016-12-29 DIAGNOSIS — K080 Exfoliation of teeth due to systemic causes: Secondary | ICD-10-CM | POA: Diagnosis not present

## 2017-02-21 DIAGNOSIS — R5382 Chronic fatigue, unspecified: Secondary | ICD-10-CM | POA: Diagnosis not present

## 2017-02-21 DIAGNOSIS — E785 Hyperlipidemia, unspecified: Secondary | ICD-10-CM | POA: Diagnosis not present

## 2017-02-21 DIAGNOSIS — E119 Type 2 diabetes mellitus without complications: Secondary | ICD-10-CM | POA: Diagnosis not present

## 2017-02-21 DIAGNOSIS — Z79899 Other long term (current) drug therapy: Secondary | ICD-10-CM | POA: Diagnosis not present

## 2017-02-21 DIAGNOSIS — I10 Essential (primary) hypertension: Secondary | ICD-10-CM | POA: Diagnosis not present

## 2017-03-10 ENCOUNTER — Other Ambulatory Visit: Payer: Self-pay | Admitting: Oral Surgery

## 2017-03-10 DIAGNOSIS — D101 Benign neoplasm of tongue: Secondary | ICD-10-CM | POA: Diagnosis not present

## 2017-04-24 DIAGNOSIS — G4719 Other hypersomnia: Secondary | ICD-10-CM | POA: Diagnosis not present

## 2017-06-14 DIAGNOSIS — M722 Plantar fascial fibromatosis: Secondary | ICD-10-CM | POA: Diagnosis not present

## 2017-06-14 DIAGNOSIS — M79672 Pain in left foot: Secondary | ICD-10-CM | POA: Diagnosis not present

## 2017-06-14 DIAGNOSIS — M79671 Pain in right foot: Secondary | ICD-10-CM | POA: Diagnosis not present

## 2017-06-14 DIAGNOSIS — E139 Other specified diabetes mellitus without complications: Secondary | ICD-10-CM | POA: Diagnosis not present

## 2017-06-14 DIAGNOSIS — B351 Tinea unguium: Secondary | ICD-10-CM | POA: Diagnosis not present

## 2017-06-29 DIAGNOSIS — M66872 Spontaneous rupture of other tendons, left ankle and foot: Secondary | ICD-10-CM | POA: Diagnosis not present

## 2017-06-29 DIAGNOSIS — M722 Plantar fascial fibromatosis: Secondary | ICD-10-CM | POA: Diagnosis not present

## 2017-06-29 DIAGNOSIS — B353 Tinea pedis: Secondary | ICD-10-CM | POA: Diagnosis not present

## 2017-06-29 DIAGNOSIS — E139 Other specified diabetes mellitus without complications: Secondary | ICD-10-CM | POA: Diagnosis not present

## 2017-07-10 DIAGNOSIS — M79672 Pain in left foot: Secondary | ICD-10-CM | POA: Diagnosis not present

## 2017-07-10 DIAGNOSIS — E139 Other specified diabetes mellitus without complications: Secondary | ICD-10-CM | POA: Diagnosis not present

## 2017-07-10 DIAGNOSIS — B353 Tinea pedis: Secondary | ICD-10-CM | POA: Diagnosis not present

## 2017-07-10 DIAGNOSIS — M66872 Spontaneous rupture of other tendons, left ankle and foot: Secondary | ICD-10-CM | POA: Diagnosis not present

## 2017-07-12 DIAGNOSIS — E785 Hyperlipidemia, unspecified: Secondary | ICD-10-CM | POA: Diagnosis not present

## 2017-07-12 DIAGNOSIS — I10 Essential (primary) hypertension: Secondary | ICD-10-CM | POA: Diagnosis not present

## 2017-07-12 DIAGNOSIS — D509 Iron deficiency anemia, unspecified: Secondary | ICD-10-CM | POA: Diagnosis not present

## 2017-07-12 DIAGNOSIS — E119 Type 2 diabetes mellitus without complications: Secondary | ICD-10-CM | POA: Diagnosis not present

## 2017-07-18 DIAGNOSIS — H40023 Open angle with borderline findings, high risk, bilateral: Secondary | ICD-10-CM | POA: Diagnosis not present

## 2017-07-18 DIAGNOSIS — E119 Type 2 diabetes mellitus without complications: Secondary | ICD-10-CM | POA: Diagnosis not present

## 2017-07-26 DIAGNOSIS — H40023 Open angle with borderline findings, high risk, bilateral: Secondary | ICD-10-CM | POA: Diagnosis not present

## 2017-07-28 ENCOUNTER — Other Ambulatory Visit: Payer: Self-pay | Admitting: Family Medicine

## 2017-07-28 DIAGNOSIS — Z1231 Encounter for screening mammogram for malignant neoplasm of breast: Secondary | ICD-10-CM

## 2017-08-02 ENCOUNTER — Ambulatory Visit
Admission: RE | Admit: 2017-08-02 | Discharge: 2017-08-02 | Disposition: A | Discharge status: Home / Self Care | Payer: Federal, State, Local not specified - PPO | Source: Ambulatory Visit | Attending: Family Medicine | Admitting: Family Medicine

## 2017-08-02 DIAGNOSIS — Z1231 Encounter for screening mammogram for malignant neoplasm of breast: Secondary | ICD-10-CM | POA: Diagnosis not present

## 2017-08-08 DIAGNOSIS — B353 Tinea pedis: Secondary | ICD-10-CM | POA: Diagnosis not present

## 2017-08-08 DIAGNOSIS — E139 Other specified diabetes mellitus without complications: Secondary | ICD-10-CM | POA: Diagnosis not present

## 2017-08-08 DIAGNOSIS — M79672 Pain in left foot: Secondary | ICD-10-CM | POA: Diagnosis not present

## 2017-08-08 DIAGNOSIS — M66872 Spontaneous rupture of other tendons, left ankle and foot: Secondary | ICD-10-CM | POA: Diagnosis not present

## 2017-09-06 DIAGNOSIS — K080 Exfoliation of teeth due to systemic causes: Secondary | ICD-10-CM | POA: Diagnosis not present

## 2017-10-11 DIAGNOSIS — E785 Hyperlipidemia, unspecified: Secondary | ICD-10-CM | POA: Diagnosis not present

## 2017-10-11 DIAGNOSIS — R221 Localized swelling, mass and lump, neck: Secondary | ICD-10-CM | POA: Diagnosis not present

## 2017-10-11 DIAGNOSIS — I10 Essential (primary) hypertension: Secondary | ICD-10-CM | POA: Diagnosis not present

## 2017-10-11 DIAGNOSIS — E119 Type 2 diabetes mellitus without complications: Secondary | ICD-10-CM | POA: Diagnosis not present

## 2017-10-14 ENCOUNTER — Other Ambulatory Visit: Payer: Self-pay | Admitting: Family Medicine

## 2017-10-14 DIAGNOSIS — R590 Localized enlarged lymph nodes: Secondary | ICD-10-CM

## 2017-10-23 ENCOUNTER — Other Ambulatory Visit: Payer: Federal, State, Local not specified - PPO

## 2017-10-30 ENCOUNTER — Ambulatory Visit
Admission: RE | Admit: 2017-10-30 | Discharge: 2017-10-30 | Disposition: A | Discharge status: Home / Self Care | Payer: Federal, State, Local not specified - PPO | Source: Ambulatory Visit | Attending: Family Medicine | Admitting: Family Medicine

## 2017-10-30 ENCOUNTER — Other Ambulatory Visit: Payer: Self-pay | Admitting: Family Medicine

## 2017-10-30 DIAGNOSIS — R590 Localized enlarged lymph nodes: Secondary | ICD-10-CM

## 2017-10-30 MED ORDER — IOPAMIDOL (ISOVUE-300) INJECTION 61%: 75.0000 mL | Freq: Once | INTRAVENOUS | Status: DC | PRN

## 2017-10-30 MED ORDER — IOPAMIDOL (ISOVUE-300) INJECTION 61%
75.0000 mL | Freq: Once | INTRAVENOUS | Status: AC | PRN
  Administered 2017-10-30: 75 mL via INTRAVENOUS

## 2017-12-20 DIAGNOSIS — M79672 Pain in left foot: Secondary | ICD-10-CM | POA: Diagnosis not present

## 2017-12-20 DIAGNOSIS — M2042 Other hammer toe(s) (acquired), left foot: Secondary | ICD-10-CM | POA: Diagnosis not present

## 2017-12-20 DIAGNOSIS — E139 Other specified diabetes mellitus without complications: Secondary | ICD-10-CM | POA: Diagnosis not present

## 2018-01-17 DIAGNOSIS — E611 Iron deficiency: Secondary | ICD-10-CM | POA: Diagnosis not present

## 2018-01-18 DIAGNOSIS — D101 Benign neoplasm of tongue: Secondary | ICD-10-CM | POA: Diagnosis not present

## 2018-01-24 DIAGNOSIS — D101 Benign neoplasm of tongue: Secondary | ICD-10-CM | POA: Diagnosis not present

## 2018-02-13 DIAGNOSIS — R220 Localized swelling, mass and lump, head: Secondary | ICD-10-CM | POA: Diagnosis not present

## 2018-02-13 DIAGNOSIS — E785 Hyperlipidemia, unspecified: Secondary | ICD-10-CM | POA: Diagnosis not present

## 2018-02-13 DIAGNOSIS — E119 Type 2 diabetes mellitus without complications: Secondary | ICD-10-CM | POA: Diagnosis not present

## 2018-02-22 DIAGNOSIS — Z124 Encounter for screening for malignant neoplasm of cervix: Secondary | ICD-10-CM | POA: Diagnosis not present

## 2018-02-22 DIAGNOSIS — Z01419 Encounter for gynecological examination (general) (routine) without abnormal findings: Secondary | ICD-10-CM | POA: Diagnosis not present

## 2018-05-01 DIAGNOSIS — K118 Other diseases of salivary glands: Secondary | ICD-10-CM | POA: Diagnosis not present

## 2018-05-01 DIAGNOSIS — D490 Neoplasm of unspecified behavior of digestive system: Secondary | ICD-10-CM | POA: Diagnosis not present

## 2018-05-10 DIAGNOSIS — M2042 Other hammer toe(s) (acquired), left foot: Secondary | ICD-10-CM | POA: Diagnosis not present

## 2018-05-10 DIAGNOSIS — E139 Other specified diabetes mellitus without complications: Secondary | ICD-10-CM | POA: Diagnosis not present

## 2018-05-10 DIAGNOSIS — M79672 Pain in left foot: Secondary | ICD-10-CM | POA: Diagnosis not present

## 2018-05-15 DIAGNOSIS — M66872 Spontaneous rupture of other tendons, left ankle and foot: Secondary | ICD-10-CM | POA: Diagnosis not present

## 2018-05-15 DIAGNOSIS — E139 Other specified diabetes mellitus without complications: Secondary | ICD-10-CM | POA: Diagnosis not present

## 2018-05-15 DIAGNOSIS — M2042 Other hammer toe(s) (acquired), left foot: Secondary | ICD-10-CM | POA: Diagnosis not present

## 2018-05-15 DIAGNOSIS — M79672 Pain in left foot: Secondary | ICD-10-CM | POA: Diagnosis not present

## 2018-05-15 DIAGNOSIS — K219 Gastro-esophageal reflux disease without esophagitis: Secondary | ICD-10-CM | POA: Diagnosis not present

## 2018-05-15 DIAGNOSIS — E119 Type 2 diabetes mellitus without complications: Secondary | ICD-10-CM | POA: Diagnosis not present

## 2018-05-15 DIAGNOSIS — R350 Frequency of micturition: Secondary | ICD-10-CM | POA: Diagnosis not present

## 2018-05-21 DIAGNOSIS — M2042 Other hammer toe(s) (acquired), left foot: Secondary | ICD-10-CM | POA: Diagnosis not present

## 2018-05-21 MED FILL — HYDROCODON-APAP 5-325: 5-325 | 3 days supply | Qty: 18 | Fill #0

## 2018-05-24 DIAGNOSIS — M2042 Other hammer toe(s) (acquired), left foot: Secondary | ICD-10-CM | POA: Diagnosis not present

## 2018-07-18 ENCOUNTER — Other Ambulatory Visit: Payer: Self-pay | Admitting: Family Medicine

## 2018-07-18 DIAGNOSIS — Z1231 Encounter for screening mammogram for malignant neoplasm of breast: Secondary | ICD-10-CM

## 2018-07-20 DIAGNOSIS — Z23 Encounter for immunization: Secondary | ICD-10-CM | POA: Diagnosis not present

## 2018-07-20 DIAGNOSIS — Z1211 Encounter for screening for malignant neoplasm of colon: Secondary | ICD-10-CM | POA: Diagnosis not present

## 2018-07-20 DIAGNOSIS — K219 Gastro-esophageal reflux disease without esophagitis: Secondary | ICD-10-CM | POA: Diagnosis not present

## 2018-07-20 DIAGNOSIS — E119 Type 2 diabetes mellitus without complications: Secondary | ICD-10-CM | POA: Diagnosis not present

## 2018-07-20 DIAGNOSIS — M1990 Unspecified osteoarthritis, unspecified site: Secondary | ICD-10-CM | POA: Diagnosis not present

## 2018-07-30 DIAGNOSIS — K080 Exfoliation of teeth due to systemic causes: Secondary | ICD-10-CM | POA: Diagnosis not present

## 2018-08-01 DIAGNOSIS — F419 Anxiety disorder, unspecified: Secondary | ICD-10-CM | POA: Diagnosis not present

## 2018-08-10 DIAGNOSIS — Z1231 Encounter for screening mammogram for malignant neoplasm of breast: Secondary | ICD-10-CM | POA: Diagnosis not present

## 2018-08-10 DIAGNOSIS — E119 Type 2 diabetes mellitus without complications: Secondary | ICD-10-CM | POA: Diagnosis not present

## 2018-09-03 ENCOUNTER — Ambulatory Visit: Payer: Federal, State, Local not specified - PPO

## 2018-09-05 DIAGNOSIS — M79671 Pain in right foot: Secondary | ICD-10-CM | POA: Diagnosis not present

## 2018-09-05 DIAGNOSIS — M2041 Other hammer toe(s) (acquired), right foot: Secondary | ICD-10-CM | POA: Diagnosis not present

## 2018-09-05 DIAGNOSIS — M79672 Pain in left foot: Secondary | ICD-10-CM | POA: Diagnosis not present

## 2018-09-05 DIAGNOSIS — M66872 Spontaneous rupture of other tendons, left ankle and foot: Secondary | ICD-10-CM | POA: Diagnosis not present

## 2018-09-11 ENCOUNTER — Other Ambulatory Visit: Payer: Self-pay | Admitting: Podiatry

## 2018-09-11 DIAGNOSIS — S8992XA Unspecified injury of left lower leg, initial encounter: Secondary | ICD-10-CM

## 2018-09-14 DIAGNOSIS — L738 Other specified follicular disorders: Secondary | ICD-10-CM | POA: Diagnosis not present

## 2018-09-14 DIAGNOSIS — L659 Nonscarring hair loss, unspecified: Secondary | ICD-10-CM | POA: Diagnosis not present

## 2018-09-14 DIAGNOSIS — L680 Hirsutism: Secondary | ICD-10-CM | POA: Diagnosis not present

## 2018-09-14 DIAGNOSIS — L720 Epidermal cyst: Secondary | ICD-10-CM | POA: Diagnosis not present

## 2018-09-24 ENCOUNTER — Ambulatory Visit
Admission: RE | Admit: 2018-09-24 | Discharge: 2018-09-24 | Disposition: A | Discharge status: Home / Self Care | Payer: Federal, State, Local not specified - PPO | Source: Ambulatory Visit | Attending: Podiatry | Admitting: Podiatry

## 2018-09-24 DIAGNOSIS — S8992XA Unspecified injury of left lower leg, initial encounter: Secondary | ICD-10-CM

## 2018-09-24 DIAGNOSIS — S86812A Strain of other muscle(s) and tendon(s) at lower leg level, left leg, initial encounter: Secondary | ICD-10-CM | POA: Diagnosis not present

## 2018-10-03 DIAGNOSIS — H40023 Open angle with borderline findings, high risk, bilateral: Secondary | ICD-10-CM | POA: Diagnosis not present

## 2018-10-09 DIAGNOSIS — M2041 Other hammer toe(s) (acquired), right foot: Secondary | ICD-10-CM | POA: Diagnosis not present

## 2018-10-09 DIAGNOSIS — M79671 Pain in right foot: Secondary | ICD-10-CM | POA: Diagnosis not present

## 2018-10-09 DIAGNOSIS — M66872 Spontaneous rupture of other tendons, left ankle and foot: Secondary | ICD-10-CM | POA: Diagnosis not present

## 2018-10-09 DIAGNOSIS — M79672 Pain in left foot: Secondary | ICD-10-CM | POA: Diagnosis not present

## 2018-10-12 DIAGNOSIS — E119 Type 2 diabetes mellitus without complications: Secondary | ICD-10-CM | POA: Diagnosis not present

## 2018-10-12 DIAGNOSIS — E785 Hyperlipidemia, unspecified: Secondary | ICD-10-CM | POA: Diagnosis not present

## 2018-10-12 DIAGNOSIS — D509 Iron deficiency anemia, unspecified: Secondary | ICD-10-CM | POA: Diagnosis not present

## 2018-11-12 DIAGNOSIS — K5730 Diverticulosis of large intestine without perforation or abscess without bleeding: Secondary | ICD-10-CM | POA: Diagnosis not present

## 2018-11-12 DIAGNOSIS — D122 Benign neoplasm of ascending colon: Secondary | ICD-10-CM | POA: Diagnosis not present

## 2018-11-12 DIAGNOSIS — Z1211 Encounter for screening for malignant neoplasm of colon: Secondary | ICD-10-CM | POA: Diagnosis not present

## 2018-11-12 DIAGNOSIS — Z800 Family history of malignant neoplasm of digestive organs: Secondary | ICD-10-CM | POA: Diagnosis not present

## 2018-11-12 DIAGNOSIS — Z86010 Personal history of colonic polyps: Secondary | ICD-10-CM | POA: Diagnosis not present

## 2019-01-29 DIAGNOSIS — Z20828 Contact with and (suspected) exposure to other viral communicable diseases: Secondary | ICD-10-CM | POA: Diagnosis not present

## 2019-03-04 DIAGNOSIS — M84375A Stress fracture, left foot, initial encounter for fracture: Secondary | ICD-10-CM | POA: Diagnosis not present

## 2019-03-04 DIAGNOSIS — E139 Other specified diabetes mellitus without complications: Secondary | ICD-10-CM | POA: Diagnosis not present

## 2019-03-04 DIAGNOSIS — M66872 Spontaneous rupture of other tendons, left ankle and foot: Secondary | ICD-10-CM | POA: Diagnosis not present

## 2019-03-20 DIAGNOSIS — E785 Hyperlipidemia, unspecified: Secondary | ICD-10-CM | POA: Diagnosis not present

## 2019-03-20 DIAGNOSIS — I10 Essential (primary) hypertension: Secondary | ICD-10-CM | POA: Diagnosis not present

## 2019-03-20 DIAGNOSIS — E119 Type 2 diabetes mellitus without complications: Secondary | ICD-10-CM | POA: Diagnosis not present

## 2019-03-20 DIAGNOSIS — Z23 Encounter for immunization: Secondary | ICD-10-CM | POA: Diagnosis not present

## 2019-03-25 DIAGNOSIS — M84375D Stress fracture, left foot, subsequent encounter for fracture with routine healing: Secondary | ICD-10-CM | POA: Diagnosis not present

## 2019-03-25 DIAGNOSIS — M66872 Spontaneous rupture of other tendons, left ankle and foot: Secondary | ICD-10-CM | POA: Diagnosis not present

## 2019-03-25 DIAGNOSIS — E139 Other specified diabetes mellitus without complications: Secondary | ICD-10-CM | POA: Diagnosis not present

## 2019-04-01 DIAGNOSIS — Z780 Asymptomatic menopausal state: Secondary | ICD-10-CM | POA: Diagnosis not present

## 2019-05-02 DIAGNOSIS — D101 Benign neoplasm of tongue: Secondary | ICD-10-CM | POA: Diagnosis not present

## 2019-06-28 DIAGNOSIS — R079 Chest pain, unspecified: Secondary | ICD-10-CM | POA: Diagnosis not present

## 2019-06-28 DIAGNOSIS — M1990 Unspecified osteoarthritis, unspecified site: Secondary | ICD-10-CM | POA: Diagnosis not present

## 2019-06-28 DIAGNOSIS — K219 Gastro-esophageal reflux disease without esophagitis: Secondary | ICD-10-CM | POA: Diagnosis not present

## 2019-06-28 DIAGNOSIS — E119 Type 2 diabetes mellitus without complications: Secondary | ICD-10-CM | POA: Diagnosis not present

## 2019-06-28 DIAGNOSIS — E785 Hyperlipidemia, unspecified: Secondary | ICD-10-CM | POA: Diagnosis not present

## 2019-06-28 DIAGNOSIS — D509 Iron deficiency anemia, unspecified: Secondary | ICD-10-CM | POA: Diagnosis not present

## 2019-07-03 ENCOUNTER — Other Ambulatory Visit: Payer: Self-pay

## 2019-07-03 ENCOUNTER — Encounter: Payer: Self-pay | Admitting: Cardiology

## 2019-07-03 ENCOUNTER — Ambulatory Visit: Payer: Federal, State, Local not specified - PPO | Admitting: Cardiology

## 2019-07-03 VITALS — BP 142/91 | HR 83 | Ht 72.0 in | Wt 290.4 lb

## 2019-07-03 DIAGNOSIS — E1169 Type 2 diabetes mellitus with other specified complication: Secondary | ICD-10-CM | POA: Diagnosis not present

## 2019-07-03 DIAGNOSIS — Z7182 Exercise counseling: Secondary | ICD-10-CM

## 2019-07-03 DIAGNOSIS — R0789 Other chest pain: Secondary | ICD-10-CM

## 2019-07-03 DIAGNOSIS — Z79899 Other long term (current) drug therapy: Secondary | ICD-10-CM

## 2019-07-03 DIAGNOSIS — E8881 Metabolic syndrome: Secondary | ICD-10-CM

## 2019-07-03 DIAGNOSIS — I10 Essential (primary) hypertension: Secondary | ICD-10-CM

## 2019-07-03 DIAGNOSIS — Z8249 Family history of ischemic heart disease and other diseases of the circulatory system: Secondary | ICD-10-CM | POA: Insufficient documentation

## 2019-07-03 DIAGNOSIS — Z713 Dietary counseling and surveillance: Secondary | ICD-10-CM

## 2019-07-03 DIAGNOSIS — E119 Type 2 diabetes mellitus without complications: Secondary | ICD-10-CM

## 2019-07-03 DIAGNOSIS — E785 Hyperlipidemia, unspecified: Secondary | ICD-10-CM

## 2019-07-03 DIAGNOSIS — E669 Obesity, unspecified: Secondary | ICD-10-CM | POA: Insufficient documentation

## 2019-07-03 DIAGNOSIS — Z7189 Other specified counseling: Secondary | ICD-10-CM

## 2019-07-03 MED ORDER — ATORVASTATIN CALCIUM 20 MG PO TABS: 20.0000 mg | ORAL_TABLET | Freq: Every day | ORAL | 11 refills | Status: AC

## 2019-07-03 MED ORDER — VALSARTAN 40 MG PO TABS: 40.0000 mg | ORAL_TABLET | Freq: Every day | ORAL | 11 refills | Status: DC

## 2019-07-03 NOTE — Patient Instructions (Signed)
Medication Instructions:  Start: Atorvastatin 20 mg daily           Valsartan 40 mg daily  If you need a refill on your cardiac medications before your next appointment, please call your pharmacy.   Lab work: Your physician recommends that you return for lab work in 2 weeks (BMP) and 3 months (Fasting Lipids, LFT)  If you have labs (blood work) drawn today and your tests are completely normal, you will receive your results only by: Marland Kitchen MyChart Message (if you have MyChart) OR . A paper copy in the mail If you have any lab test that is abnormal or we need to change your treatment, we will call you to review the results.  Testing/Procedures: Vascus[facility] [address]  Follow-Up: At [facility], you and your health needs are our priority.  As part of our continuing mission to provide you with exceptional heart care, we have created designated Provider Care Teams.  These Care Teams include your primary Cardiologist (physician) and Advanced Practice Providers (APPs -  Physician Assistants and Nurse Practitioners) who all work together to provide you with the care you need, when you need it. You will need a follow up appointment in 3 months.  Please call our office 2 months in advance to schedule this appointment.  You may see Dr. Harrell Gave or one of the following Advanced Practice Providers on your designated Care Team:   Rosaria Ferries, PA-C . Jory Sims, DNP, ANP

## 2019-07-03 NOTE — Progress Notes (Signed)
Cardiology Office Note:    Date:  07/03/2019   ID:  Nichole Harris, DOB 1956-05-10, MRN [id number]  PCP:  Nichole Dials, MD  Cardiologist:  Nichole Dresser, MD (Dr Johnsie Harris once in 2015)  Referring MD: Nichole Dials, MD   CC: "I don't know why I am here"  History of Present Illness:    Nichole Harris is a 63 y.o. female with a hx of type II diabetes who is seen as a new consult at the request of Nichole Dials, MD for the evaluation and management of chest pain.  She was unsure of why she was referred, but I reviewed Dr. March Harris most recent note with here. She endorsed left sided pain, as below. We also took an hour today to discuss cardiovascular risk factors and prevention in detail, summarized below.  Chest pain: -Initial onset: "always had it," many years.  -Quality: cramp/pressure from left side to left pectoral region. Very tender. Has had multiple mammograms. -Frequency/Duration: always present but sometimes more intense -Associated symptoms: tenderness -Aggravating/alleviating factors: not exertional, not positional, not related to food. No clear aggravating/alleviating factors -Prior cardiac history: told she had an enlarged heart years ago, distant stress test >2013 in [place], [place] (thinks 2006/2007) before she moved to [place] -Prior ECG: NSR -Prior workup: stress test normal distant -Prior treatment: none -Alcohol: weekly, 1 drink -Tobacco: never -Comorbidities: obesity -Exercise level: rare intentional, has exercise bike at home. Drives heavy machinery, up and down to get in/out.  -Cardiac ROS: no shortness of breath, no PND, no orthopnea, no LE edema, no syncope -Family history: Dad died age 52 from MI in 35, HTN, heart disease, diabetes. Mother still alive (born 31), HTN, heart disease, colon obstructions s/p surgeries, diabetes. 3 brothers: glaucoma, diabetes, drug addiction. 3 sisters: HTN, bipolar, diabetes, heart problems (has had ablations).  1 daughtter, healthy (nurse in [facility]).  History of ulcers, has not been on aspirin 2/2 this.  We did extensive education today on risk factors, diet/exercise, guideline recommended therapy especially with diabetes -discussed pathology of cholesterol, how plaques form, that MI/CVA result commonly from acute plaque rupture and not gradual stenosis. Discussed mechanism of statin to both decrease plaque accumulation and stabilize plaque that is already present.  -she was prescribed atorvastatin in the past but never received, has not taken -BP consistently over goal at home, >140s/80s. Reviewed guidelines, target of <130/80. Has not been on meds recently, believes she was on lisinopril in the past and it made her jittery -reviewed her recent labs, did ASCVD risk score with her  Denies shortness of breath at rest or with normal exertion. No PND, orthopnea, LE edema or unexpected weight gain. No syncope or palpitations. Has noted that her HR has gone from 70s resting to 80s resting. Discussed role of conditioning/vagal tone for this.  Past Medical History:  Diagnosis Date  . Anemia   . Diabetes mellitus without complication ([place])   . Diverticulitis   . Hypertension   . Reflux   . Uterine fibroids affecting pregnancy     Past Surgical History:  Procedure Laterality Date  . HAMMER TOE SURGERY    . lumpectomy    . TONSILLECTOMY    . UFE      Current Medications: Current Outpatient Medications on File Prior to Visit  Medication Sig  . Esomeprazole Magnesium (NEXIUM PO) Take by mouth daily.  Marland Kitchen glipiZIDE (GLUCOTROL XL) 5 MG 24 hr tablet Take 1 tablet by mouth daily.  Marland Kitchen ibuprofen (ADVIL) 800 MG tablet  Take 1 tablet by mouth as directed.   . Magnesium 250 MG TABS Take by mouth daily.  . metFORMIN (GLUCOPHAGE) 500 MG tablet Take 500 mg by mouth daily with breakfast.  . Multiple Vitamins-Minerals (MULTIVITAMIN ADULT PO) Take by mouth.  . naproxen sodium (ANAPROX) 220 MG tablet Take 220 mg by  mouth 2 (two) times daily with a meal.   No current facility-administered medications on file prior to visit.      Allergies:   Patient has no known allergies.   Social History   Tobacco Use  . Smoking status: Never Smoker  . Smokeless tobacco: Never Used  Substance Use Topics  . Alcohol use: No  . Drug use: No    Family History: family history includes AAA (abdominal aortic aneurysm) in her father and mother; Heart disease in her father.  ROS:   Please see the history of present illness.  Additional pertinent ROS: Constitutional: Negative for chills, fever, night sweats, unintentional weight loss  HENT: Negative for ear pain and hearing loss.   Eyes: Negative for loss of vision and eye pain.  Respiratory: Negative for cough, sputum, wheezing.   Cardiovascular: See HPI. Gastrointestinal: Negative for abdominal pain, melena, and hematochezia.  Genitourinary: Negative for dysuria and hematuria.  Musculoskeletal: Negative for falls and myalgias.  Skin: Negative for itching and rash.  Neurological: Negative for focal weakness, focal sensory changes and loss of consciousness.  Endo/Heme/Allergies: Does not bruise/bleed easily.     EKGs/Labs/Other Studies Reviewed:    The following studies were reviewed today: Prior notes and ECG  EKG:  EKG is personally reviewed.  The ekg ordered today demonstrates NSR  Recent Labs: No results found for requested labs within last 8760 hours.  Recent Lipid Panel No results found for: CHOL, TRIG, HDL, CHOLHDL, VLDL, LDLCALC, LDLDIRECT  Physical Exam:    VS:  BP (!) 142/91   Pulse 83   Ht 6' (1.829 m)   Wt 290 lb 6.4 oz (131.7 kg)   SpO2 98%   BMI 39.39 kg/m     Wt Readings from Last 3 Encounters:  07/03/19 290 lb 6.4 oz (131.7 kg)  10/19/16 299 lb (135.6 kg)  05/18/16 296 lb 11.2 oz (134.6 kg)    GEN: Well nourished, well developed in no acute distress HEENT: Normal, moist mucous membranes NECK: No JVD CARDIAC: regular  rhythm, normal S1 and S2, no murmurs, rubs, gallops. TTP over left pectoral region VASCULAR: Radial and DP pulses 2+ bilaterally. No carotid bruits RESPIRATORY:  Clear to auscultation without rales, wheezing or rhonchi  ABDOMEN: Soft, non-tender, non-distended MUSCULOSKELETAL:  Ambulates independently SKIN: Warm and dry, no edema NEUROLOGIC:  Alert and oriented x 3. No focal neuro deficits noted. PSYCHIATRIC:  Normal affect    ASSESSMENT:    1. Other chest pain   2. Essential hypertension   3. Hyperlipidemia due to type 2 diabetes mellitus ([place])   4. Medication management   5. Family history of heart disease   6. Cardiac risk counseling   7. Counseling on health promotion and disease prevention   8. Nutritional counseling   9. Exercise counseling   10. Type 2 diabetes mellitus with obesity ([place])   11. Metabolic syndrome    PLAN:    Chest pain: chronic, constant, tender to palpation: low suspicion for cardiac etiology. Suspect MSK syndrome -discussed guidelines re: indications for testing. She is low suspicion, low benefit to further testing at this time -discussed red flag warning signs that need immediate medical  attention -if symptoms change, she will call  Hyperlipidemia related to type II diabetes, obesity: meets criteria for metabolic syndrome (diabetes, HDL <50, abdominal adiposity, hypertension) -start atorvastatin 20 mg daily -recheck lipids/LFTs 3 mos  Hypertension: goal <130/80, above goal consistently -will start valsartan 40 mg daily -instructed on home BP checks -recheck BMET 2 weeks, recent Cr 0.66, recnet K 4.6  Cardiac risk counseling and prevention recommendations: has diabetes, hypercholesterolemia, obesity, family history, metabolic syndrome as risk factors -recommend heart healthy/Mediterranean diet, with whole grains, fruits, vegetable, fish, lean meats, nuts, and olive oil. Limit salt. -recommend moderate walking, 3-5 times/week for 30-50 minutes each  session. Aim for at least 150 minutes.week. Goal should be pace of 3 miles/hours, or walking 1.5 miles in 30 minutes -recommend avoidance of tobacco products. Avoid excess alcohol. -Additional risk factor control:  -Diabetes: A1c is 6.6, on metformin and glipizide  -Lipids: 06/2019 no statin: Tchol 172, HDL 48, LDL 98, TG 135  -Blood pressure control: as above  -Weight: BMI 39, obesity in type II diabetes, working on weight loss -ASCVD risk score: 16.8% today -reports prior ulcers, not on aspirin. Need to be careful with PRN NSAIDs as well -interested in vascuscreen, ordered today  Plan for follow up: BMET 2 weeks, vascuscreen, follow up 3 mos with lipids/LFTs  TIME SPENT WITH PATIENT: 60 minutes of direct patient care. More than 50% of that time was spent on coordination of care and counseling regarding chest pain, risk factors, education, guideline discussion, plan for management.  Nichole Dresser, MD, PhD [place]  [facility]   Medication Adjustments/Labs and Tests Ordered: Current medicines are reviewed at length with the patient today.  Concerns regarding medicines are outlined above.  Orders Placed This Encounter  Procedures  . Basic metabolic panel  . Lipid panel  . Hepatic function panel  . EKG 12-Lead  . VAS US VASCUSCREEN   Meds ordered this encounter  Medications  . valsartan (DIOVAN) 40 MG tablet    Sig: Take 1 tablet (40 mg total) by mouth daily.    Dispense:  30 tablet    Refill:  11  . atorvastatin (LIPITOR) 20 MG tablet    Sig: Take 1 tablet (20 mg total) by mouth daily.    Dispense:  30 tablet    Refill:  11    Patient Instructions  Medication Instructions:  Start: Atorvastatin 20 mg daily           Valsartan 40 mg daily  If you need a refill on your cardiac medications before your next appointment, please call your pharmacy.   Lab work: Your physician recommends that you return for lab work in 2 weeks (BMP) and 3 months (Fasting Lipids,  LFT)  If you have labs (blood work) drawn today and your tests are completely normal, you will receive your results only by: Marland Kitchen MyChart Message (if you have MyChart) OR . A paper copy in the mail If you have any lab test that is abnormal or we need to change your treatment, we will call you to review the results.  Testing/Procedures: [facility] [address]  Follow-Up: At [facility], you and your health needs are our priority.  As part of our continuing mission to provide you with exceptional heart care, we have created designated Provider Care Teams.  These Care Teams include your primary Cardiologist (physician) and Advanced Practice Providers (APPs -  Physician Assistants and Nurse Practitioners) who all work together to provide you with the  care you need, when you need it. You will need a follow up appointment in 3 months.  Please call our office 2 months in advance to schedule this appointment.  You may see Dr. Harrell Gave or one of the following Advanced Practice Providers on your designated Care Team:   Rosaria Ferries, PA-C . Jory Sims, DNP, ANP       Signed, Nichole Dresser, MD PhD 07/03/2019 9:25 AM    [place]

## 2019-07-04 ENCOUNTER — Encounter: Payer: Self-pay | Admitting: *Deleted

## 2019-07-04 ENCOUNTER — Ambulatory Visit (HOSPITAL_COMMUNITY)
Admission: RE | Admit: 2019-07-04 | Discharge: 2019-07-04 | Disposition: A | Discharge status: Home / Self Care | Payer: Federal, State, Local not specified - PPO | Source: Ambulatory Visit | Attending: Cardiology | Admitting: Cardiology

## 2019-07-04 ENCOUNTER — Ambulatory Visit (HOSPITAL_COMMUNITY): Payer: Federal, State, Local not specified - PPO

## 2019-07-04 ENCOUNTER — Other Ambulatory Visit: Payer: Self-pay | Admitting: Family Medicine

## 2019-07-04 DIAGNOSIS — E1169 Type 2 diabetes mellitus with other specified complication: Secondary | ICD-10-CM

## 2019-07-04 DIAGNOSIS — E785 Hyperlipidemia, unspecified: Secondary | ICD-10-CM

## 2019-07-04 DIAGNOSIS — N644 Mastodynia: Secondary | ICD-10-CM

## 2019-07-08 ENCOUNTER — Other Ambulatory Visit: Payer: Self-pay | Admitting: Family Medicine

## 2019-07-08 DIAGNOSIS — R1032 Left lower quadrant pain: Secondary | ICD-10-CM

## 2019-07-12 DIAGNOSIS — K5730 Diverticulosis of large intestine without perforation or abscess without bleeding: Secondary | ICD-10-CM | POA: Diagnosis not present

## 2019-07-17 ENCOUNTER — Encounter: Payer: Self-pay | Admitting: Cardiology

## 2019-07-17 DIAGNOSIS — Z79899 Other long term (current) drug therapy: Secondary | ICD-10-CM | POA: Diagnosis not present

## 2019-07-18 LAB — BASIC METABOLIC PANEL
BUN/Creatinine Ratio: 18 (ref 12–28)
BUN: 14 mg/dL (ref 8–27)
CO2: 23 mmol/L (ref 20–29)
Calcium: 10.4 mg/dL — ABNORMAL HIGH (ref 8.7–10.3)
Chloride: 100 mmol/L (ref 96–106)
Creatinine, Ser: 0.77 mg/dL (ref 0.57–1.00)
GFR calc Af Amer: 96 mL/min/{1.73_m2} (ref >59)
GFR calc non Af Amer: 83 mL/min/{1.73_m2} (ref >59)
Glucose: 127 mg/dL — ABNORMAL HIGH (ref 65–99)
Potassium: 4.3 mmol/L (ref 3.5–5.2)
Sodium: 137 mmol/L (ref 134–144)

## 2019-07-20 DIAGNOSIS — R922 Inconclusive mammogram: Secondary | ICD-10-CM | POA: Diagnosis not present

## 2019-07-20 DIAGNOSIS — N644 Mastodynia: Secondary | ICD-10-CM | POA: Diagnosis not present

## 2019-10-01 ENCOUNTER — Telehealth: Payer: Self-pay | Admitting: *Deleted

## 2019-10-01 NOTE — Telephone Encounter (Signed)
A message was left, re: her follow up visit. 

## 2019-10-10 ENCOUNTER — Emergency Department (HOSPITAL_BASED_OUTPATIENT_CLINIC_OR_DEPARTMENT_OTHER)
Admission: EM | Admit: 2019-10-10 | Discharge: 2019-10-10 | Disposition: A | Discharge status: Home / Self Care | Payer: Federal, State, Local not specified - PPO | Attending: Emergency Medicine | Admitting: Emergency Medicine

## 2019-10-10 ENCOUNTER — Other Ambulatory Visit: Payer: Self-pay

## 2019-10-10 ENCOUNTER — Encounter (HOSPITAL_BASED_OUTPATIENT_CLINIC_OR_DEPARTMENT_OTHER): Payer: Self-pay | Admitting: *Deleted

## 2019-10-10 ENCOUNTER — Emergency Department (HOSPITAL_BASED_OUTPATIENT_CLINIC_OR_DEPARTMENT_OTHER): Payer: Federal, State, Local not specified - PPO

## 2019-10-10 DIAGNOSIS — R1084 Generalized abdominal pain: Secondary | ICD-10-CM

## 2019-10-10 DIAGNOSIS — R932 Abnormal findings on diagnostic imaging of liver and biliary tract: Secondary | ICD-10-CM | POA: Insufficient documentation

## 2019-10-10 DIAGNOSIS — K5730 Diverticulosis of large intestine without perforation or abscess without bleeding: Secondary | ICD-10-CM | POA: Diagnosis not present

## 2019-10-10 DIAGNOSIS — R109 Unspecified abdominal pain: Secondary | ICD-10-CM | POA: Diagnosis not present

## 2019-10-10 DIAGNOSIS — E119 Type 2 diabetes mellitus without complications: Secondary | ICD-10-CM | POA: Insufficient documentation

## 2019-10-10 DIAGNOSIS — Z7984 Long term (current) use of oral hypoglycemic drugs: Secondary | ICD-10-CM | POA: Insufficient documentation

## 2019-10-10 DIAGNOSIS — I10 Essential (primary) hypertension: Secondary | ICD-10-CM | POA: Insufficient documentation

## 2019-10-10 DIAGNOSIS — R160 Hepatomegaly, not elsewhere classified: Secondary | ICD-10-CM

## 2019-10-10 DIAGNOSIS — Z79899 Other long term (current) drug therapy: Secondary | ICD-10-CM | POA: Insufficient documentation

## 2019-10-10 DIAGNOSIS — Z03818 Encounter for observation for suspected exposure to other biological agents ruled out: Secondary | ICD-10-CM | POA: Diagnosis not present

## 2019-10-10 DIAGNOSIS — Z20822 Contact with and (suspected) exposure to covid-19: Secondary | ICD-10-CM | POA: Insufficient documentation

## 2019-10-10 LAB — COMPREHENSIVE METABOLIC PANEL
ALT: 23 U/L (ref 0–44)
AST: 21 U/L (ref 15–41)
Albumin: 4 g/dL (ref 3.5–5.0)
Alkaline Phosphatase: 100 U/L (ref 38–126)
Anion gap: 9 (ref 5–15)
BUN: 13 mg/dL (ref 8–23)
CO2: 25 mmol/L (ref 22–32)
Calcium: 10 mg/dL (ref 8.9–10.3)
Chloride: 104 mmol/L (ref 98–111)
Creatinine, Ser: 0.65 mg/dL (ref 0.44–1.00)
GFR calc Af Amer: >60 mL/min (ref >60)
GFR calc non Af Amer: >60 mL/min (ref >60)
Glucose, Bld: 109 mg/dL — ABNORMAL HIGH (ref 70–99)
Potassium: 4.2 mmol/L (ref 3.5–5.1)
Sodium: 138 mmol/L (ref 135–145)
Total Bilirubin: 0.5 mg/dL (ref 0.3–1.2)
Total Protein: 7.7 g/dL (ref 6.5–8.1)

## 2019-10-10 LAB — CBC WITH DIFFERENTIAL/PLATELET
Abs Immature Granulocytes: 0.01 10*3/uL (ref 0.00–0.07)
Basophils Absolute: 0 10*3/uL (ref 0.0–0.1)
Basophils Relative: 0 %
Eosinophils Absolute: 0.1 10*3/uL (ref 0.0–0.5)
Eosinophils Relative: 2 %
HCT: 38.1 % (ref 36.0–46.0)
Hemoglobin: 12.6 g/dL (ref 12.0–15.0)
Immature Granulocytes: 0 %
Lymphocytes Relative: 42 %
Lymphs Abs: 1.5 10*3/uL (ref 0.7–4.0)
MCH: 31.3 pg (ref 26.0–34.0)
MCHC: 33.1 g/dL (ref 30.0–36.0)
MCV: 94.5 fL (ref 80.0–100.0)
Monocytes Absolute: 0.4 10*3/uL (ref 0.1–1.0)
Monocytes Relative: 10 %
Neutro Abs: 1.7 10*3/uL (ref 1.7–7.7)
Neutrophils Relative %: 46 %
Platelets: 244 10*3/uL (ref 150–400)
RBC: 4.03 MIL/uL (ref 3.87–5.11)
RDW: 13.3 % (ref 11.5–15.5)
WBC: 3.6 10*3/uL — ABNORMAL LOW (ref 4.0–10.5)
nRBC: 0 % (ref 0.0–0.2)

## 2019-10-10 LAB — URINALYSIS, ROUTINE W REFLEX MICROSCOPIC
Bilirubin Urine: NEGATIVE
Glucose, UA: NEGATIVE mg/dL
Hgb urine dipstick: NEGATIVE
Ketones, ur: NEGATIVE mg/dL
Leukocytes,Ua: NEGATIVE
Nitrite: NEGATIVE
Protein, ur: NEGATIVE mg/dL
Specific Gravity, Urine: 1.025 (ref 1.005–1.030)
pH: 6 (ref 5.0–8.0)

## 2019-10-10 LAB — LIPASE, BLOOD: Lipase: 18 U/L (ref 11–51)

## 2019-10-10 MED ORDER — ONDANSETRON HCL 4 MG/2ML IJ SOLN
4.0000 mg | Freq: Once | INTRAMUSCULAR | Status: AC
  Administered 2019-10-10: 4 mg via INTRAVENOUS
  Filled 2019-10-10: qty 2

## 2019-10-10 MED ORDER — MORPHINE SULFATE (PF) 2 MG/ML IV SOLN: 2.0000 mg | Freq: Once | INTRAVENOUS | Status: DC

## 2019-10-10 MED ORDER — IOHEXOL 300 MG/ML  SOLN
100.0000 mL | Freq: Once | INTRAMUSCULAR | Status: AC
  Administered 2019-10-10: 100 mL via INTRAVENOUS

## 2019-10-10 MED ORDER — SODIUM CHLORIDE 0.9 % IV BOLUS
1000.0000 mL | Freq: Once | INTRAVENOUS | Status: AC
  Administered 2019-10-10: 1000 mL via INTRAVENOUS

## 2019-10-10 MED ORDER — MORPHINE SULFATE (PF) 4 MG/ML IV SOLN: 4.0000 mg | Freq: Once | INTRAVENOUS | Status: DC

## 2019-10-10 NOTE — ED Triage Notes (Signed)
C/o diffuse abd pain 6 days , hx diverticulitis

## 2019-10-10 NOTE — Discharge Instructions (Addendum)
As we discussed here your work-up was reassuring.    Your CT scan did show evidence of an abnormality on your liver that need to follow-up with your primary care doctor and an outpatient MRI.  Please call them for arrangement of imaging.  As we discussed also, you have a Covid test pending.  This will come back in about 24 hours.  You can check online regarding the results.  You should quarantine until results come back.  Return the emergency department for any worsening abdominal pain, vomiting, difficulty breathing, chest pain or any other worsening or concerning symptoms.     Person Under Monitoring Name: Nichole Harris  Location: [place] [address]   Infection Prevention Recommendations for Individuals Confirmed to have, or Being Evaluated for, 2019 Novel Coronavirus (COVID-19) Infection Who Receive Care at Home  Individuals who are confirmed to have, or are being evaluated for, COVID-19 should follow the prevention steps below until a healthcare provider or local or state health department says they can return to normal activities.  Stay home except to get medical care You should restrict activities outside your home, except for getting medical care. Do not go to work, school, or public areas, and do not use public transportation or taxis.  Call ahead before visiting your doctor Before your medical appointment, call the healthcare provider and tell them that you have, or are being evaluated for, COVID-19 infection. This will help the healthcare provider's office take steps to keep other people from getting infected. Ask your healthcare provider to call the local or state health department.  Monitor your symptoms Seek prompt medical attention if your illness is worsening (e.g., difficulty breathing). Before going to your medical appointment, call the healthcare provider and tell them that you have, or are being evaluated for, COVID-19 infection.  Ask your healthcare provider to call the local or state health department.  Wear a facemask You should wear a facemask that covers your nose and mouth when you are in the same room with other people and when you visit a healthcare provider. People who live with or visit you should also wear a facemask while they are in the same room with you.  Separate yourself from other people in your home As much as possible, you should stay in a different room from other people in your home. Also, you should use a separate bathroom, if available.  Avoid sharing household items You should not share dishes, drinking glasses, cups, eating utensils, towels, bedding, or other items with other people in your home. After using these items, you should wash them thoroughly with soap and water.  Cover your coughs and sneezes Cover your mouth and nose with a tissue when you cough or sneeze, or you can cough or sneeze into your sleeve. Throw used tissues in a lined trash can, and immediately wash your hands with soap and water for at least 20 seconds or use an alcohol-based hand rub.  Wash your [facility] your hands often and thoroughly with soap and water for at least 20 seconds. You can use an alcohol-based hand sanitizer if soap and water are not available and if your hands are not visibly dirty. Avoid touching your eyes, nose, and mouth with unwashed hands.   Prevention Steps for Caregivers and Household Members of Individuals Confirmed to have, or Being Evaluated for, COVID-19 Infection Being Cared for in the Home  If you live with, or provide care at home for, a person  confirmed to have, or being evaluated for, COVID-19 infection please follow these guidelines to prevent infection:  Follow healthcare provider's instructions Make sure that you understand and can help the patient follow any healthcare provider instructions for all care.  Provide for the patient's basic needs You should help the  patient with basic needs in the home and provide support for getting groceries, prescriptions, and other personal needs.  Monitor the patient's symptoms If they are getting sicker, call his or her medical provider and tell them that the patient has, or is being evaluated for, COVID-19 infection. This will help the healthcare provider's office take steps to keep other people from getting infected. Ask the healthcare provider to call the local or state health department.  Limit the number of people who have contact with the patient If possible, have only one caregiver for the patient. Other household members should stay in another home or place of residence. If this is not possible, they should stay in another room, or be separated from the patient as much as possible. Use a separate bathroom, if available. Restrict visitors who do not have an essential need to be in the home.  Keep older adults, very young children, and other sick people away from the patient Keep older adults, very young children, and those who have compromised immune systems or chronic health conditions away from the patient. This includes people with chronic heart, lung, or kidney conditions, diabetes, and cancer.  Ensure good ventilation Make sure that shared spaces in the home have good air flow, such as from an air conditioner or an opened window, weather permitting.  Wash your hands often Wash your hands often and thoroughly with soap and water for at least 20 seconds. You can use an alcohol based hand sanitizer if soap and water are not available and if your hands are not visibly dirty. Avoid touching your eyes, nose, and mouth with unwashed hands. Use disposable paper towels to dry your hands. If not available, use dedicated cloth towels and replace them when they become wet.  Wear a facemask and gloves Wear a disposable facemask at all times in the room and gloves when you touch or have contact with the patient's  blood, body fluids, and/or secretions or excretions, such as sweat, saliva, sputum, nasal mucus, vomit, urine, or feces.  Ensure the mask fits over your nose and mouth tightly, and do not touch it during use. Throw out disposable facemasks and gloves after using them. Do not reuse. Wash your hands immediately after removing your facemask and gloves. If your personal clothing becomes contaminated, carefully remove clothing and launder. Wash your hands after handling contaminated clothing. Place all used disposable facemasks, gloves, and other waste in a lined container before disposing them with other household waste. Remove gloves and wash your hands immediately after handling these items.  Do not share dishes, glasses, or other household items with the patient Avoid sharing household items. You should not share dishes, drinking glasses, cups, eating utensils, towels, bedding, or other items with a patient who is confirmed to have, or being evaluated for, COVID-19 infection. After the person uses these items, you should wash them thoroughly with soap and water.  Wash laundry thoroughly Immediately remove and wash clothes or bedding that have blood, body fluids, and/or secretions or excretions, such as sweat, saliva, sputum, nasal mucus, vomit, urine, or feces, on them. Wear gloves when handling laundry from the patient. Read and follow directions on labels of laundry or clothing  items and detergent. In general, wash and dry with the warmest temperatures recommended on the label.  Clean all areas the individual has used often Clean all touchable surfaces, such as counters, tabletops, doorknobs, bathroom fixtures, toilets, phones, keyboards, tablets, and bedside tables, every day. Also, clean any surfaces that may have blood, body fluids, and/or secretions or excretions on them. Wear gloves when cleaning surfaces the patient has come in contact with. Use a diluted bleach solution (e.g., dilute  bleach with 1 part bleach and 10 parts water) or a household disinfectant with a label that says EPA-registered for coronaviruses. To make a bleach solution at home, add 1 tablespoon of bleach to 1 quart (4 cups) of water. For a larger supply, add  cup of bleach to 1 gallon (16 cups) of water. Read labels of cleaning products and follow recommendations provided on product labels. Labels contain instructions for safe and effective use of the cleaning product including precautions you should take when applying the product, such as wearing gloves or eye protection and making sure you have good ventilation during use of the product. Remove gloves and wash hands immediately after cleaning.  Monitor yourself for signs and symptoms of illness Caregivers and household members are considered close contacts, should monitor their health, and will be asked to limit movement outside of the home to the extent possible. Follow the monitoring steps for close contacts listed on the symptom monitoring form.   ? If you have additional questions, contact your local health department or call the epidemiologist on call at [phone] (available 24/7). ? This guidance is subject to change. For the most up-to-date guidance from [facility], please refer to their website: [email or web address]

## 2019-10-10 NOTE — ED Notes (Signed)
Pt is driving; she refused the lower dose of Morphine.

## 2019-10-10 NOTE — ED Provider Notes (Signed)
[facility] EMERGENCY DEPARTMENT Provider Note   CSN: [id number] Arrival date & time: 10/10/19  1346     History Chief Complaint  Patient presents with   Abdominal Pain    Nichole Harris is a 64 y.o. female with PMH/o DM, Diverticulitis, HTN who presents for evaluation of abdominal pain that has been ongoing for the last 5 to 6 days.  She states that initially, pain started after she ate some pistachios about 6 days ago.  She thought that maybe she had a flare of her diverticulitis and was letting it resolve on its own.  She states it is continued to linger.  She reports that it is diffusely tender with no specific focal point.  She reports that today at about 12:30 PM while at work, her pain got acutely worse and was more in the right flank radiating to the right abdomen.  She describes it as sharp pains.  She has not had any associated nausea/vomiting.  She has not had any difficulty eating.  She states she has not had any diarrhea.  Her last bowel movement was this morning.  She still passing flatus.  She denies any fevers, congestion, chest pain, difficulty breathing, dysuria, hematuria.  The history is provided by the patient.       Past Medical History:  Diagnosis Date   Anemia    Diabetes mellitus without complication ([place])    Diverticulitis    Hypertension    Reflux    Uterine fibroids affecting pregnancy     Patient Active Problem List   Diagnosis Date Noted   Type 2 diabetes mellitus with obesity ([place]) 07/03/2019   Hyperlipidemia due to type 2 diabetes mellitus ([place]) 07/03/2019   Family history of heart disease [id number]   Metabolic syndrome [id number]   Preop cardiovascular exam 01/03/2014   Diabetes mellitus without complication ([place])    Hypertension    Reflux    Diverticulitis     Past Surgical History:  Procedure Laterality Date   HAMMER TOE SURGERY     lumpectomy     TONSILLECTOMY     UFE       OB History   No  obstetric history on file.     Family History  Problem Relation Age of Onset   AAA (abdominal aortic aneurysm) Mother    AAA (abdominal aortic aneurysm) Father    Heart disease Father     Social History   Tobacco Use   Smoking status: Never Smoker   Smokeless tobacco: Never Used  Substance Use Topics   Alcohol use: No   Drug use: No    Home Medications Prior to Admission medications   Medication Sig Start Date End Date Taking? Authorizing Provider  atorvastatin (LIPITOR) 20 MG tablet Take 1 tablet (20 mg total) by mouth daily. 07/03/19 06/27/20  Buford Dresser, MD  Esomeprazole Magnesium (NEXIUM PO) Take by mouth daily.    [provider]  glipiZIDE (GLUCOTROL XL) 5 MG 24 hr tablet Take 1 tablet by mouth daily. 03/20/19   [provider]  ibuprofen (ADVIL) 800 MG tablet Take 1 tablet by mouth as directed.  05/08/19   [provider]  Magnesium 250 MG TABS Take by mouth daily.    [provider]  metFORMIN (GLUCOPHAGE) 500 MG tablet Take 500 mg by mouth daily with breakfast.    [provider]  Multiple Vitamins-Minerals (MULTIVITAMIN ADULT PO) Take by mouth.    [provider]  naproxen sodium (ANAPROX) 220  MG tablet Take 220 mg by mouth 2 (two) times daily with a meal.    [provider]  valsartan (DIOVAN) 40 MG tablet Take 1 tablet (40 mg total) by mouth daily. 07/03/19   Buford Dresser, MD    Allergies    Patient has no known allergies.  Review of Systems   Review of Systems  Constitutional: Negative for fever.  Respiratory: Negative for cough and shortness of breath.   Cardiovascular: Negative for chest pain.  Gastrointestinal: Positive for abdominal pain. Negative for nausea and vomiting.  Genitourinary: Positive for flank pain. Negative for dysuria and hematuria.  Neurological: Negative for headaches.  All other systems reviewed and are negative.   Physical Exam Updated Vital  Signs BP (!) 150/75 (BP Location: Left Arm)    Pulse 67    Temp 98.5 F (36.9 C) (Oral)    Resp 16    Ht 6' (1.829 m)    Wt 127 kg    SpO2 100%    BMI 37.97 kg/m   Physical Exam Vitals and nursing note reviewed.  Constitutional:      Appearance: Normal appearance. She is well-developed.     Comments: Sitting comfortably on examination table  HENT:     Head: Normocephalic and atraumatic.  Eyes:     General: Lids are normal.     Conjunctiva/sclera: Conjunctivae normal.     Pupils: Pupils are equal, round, and reactive to light.  Cardiovascular:     Rate and Rhythm: Normal rate and regular rhythm.     Pulses: Normal pulses.     Heart sounds: Normal heart sounds. No murmur. No friction rub. No gallop.   Pulmonary:     Effort: Pulmonary effort is normal.     Breath sounds: Normal breath sounds.     Comments: Lungs clear to auscultation bilaterally.  Symmetric chest rise.  No wheezing, rales, rhonchi. Abdominal:     Palpations: Abdomen is soft. Abdomen is not rigid.     Tenderness: There is abdominal tenderness in the right lower quadrant. There is right CVA tenderness. There is no guarding.     Comments: Abdomen is soft, nondistended.  Generalized tenderness into the right lower quadrant as well as some right-sided CVA tenderness.  No rigidity, guarding.  No left-sided CVA tenderness.  Musculoskeletal:        General: Normal range of motion.     Cervical back: Full passive range of motion without pain.  Skin:    General: Skin is warm and dry.     Capillary Refill: Capillary refill takes less than 2 seconds.  Neurological:     Mental Status: She is alert and oriented to person, place, and time.  Psychiatric:        Speech: Speech normal.     ED Results / Procedures / Treatments   Labs (all labs ordered are listed, but only abnormal results are displayed) Labs Reviewed  CBC WITH DIFFERENTIAL/PLATELET - Abnormal; Notable for the following components:      Result Value   WBC 3.6  (*)    All other components within normal limits  COMPREHENSIVE METABOLIC PANEL - Abnormal; Notable for the following components:   Glucose, Bld 109 (*)    All other components within normal limits  SARS CORONAVIRUS 2 (TAT 6-24 HRS)  URINALYSIS, ROUTINE W REFLEX MICROSCOPIC  LIPASE, BLOOD    EKG None  Radiology CT ABDOMEN PELVIS W CONTRAST  Result Date: 10/10/2019 CLINICAL DATA:  Diffuse abdominal pain for 6  days. EXAM: CT ABDOMEN AND PELVIS WITH CONTRAST TECHNIQUE: Multidetector CT imaging of the abdomen and pelvis was performed using the standard protocol following bolus administration of intravenous contrast. CONTRAST:  113mL OMNIPAQUE IOHEXOL 300 MG/ML  SOLN COMPARISON:  None. FINDINGS: Lower chest: No significant pulmonary nodules or acute consolidative airspace disease. Hepatobiliary: Normal liver size. Exophytic segment 4B left liver lobe 2.3 cm hypodense mass (series 2/image 28). A few scattered simple liver cysts, largest 2.1 cm in the posteroinferior right liver lobe. Additional scattered subcentimeter hypodense liver lesions are too small to characterize. Normal gallbladder with no radiopaque cholelithiasis. No biliary ductal dilatation. Pancreas: Normal, with no mass or duct dilation. Spleen: Normal size. No mass. Adrenals/Urinary Tract: Normal adrenals. Normal size kidneys. Symmetric normal contrast nephrograms. Subcentimeter hypodense renal cortical lesion in the posterior interpolar left kidney is too small to characterize and requires no follow-up. No hydronephrosis. Normal bladder. Stomach/Bowel: Small hiatal hernia. Otherwise normal nondistended stomach. Normal caliber small bowel with no small bowel wall thickening. Normal appendix. Marked diffuse colonic diverticulosis, with no large bowel wall thickening or significant pericolonic fat stranding. Vascular/Lymphatic: Atherosclerotic nonaneurysmal abdominal aorta. Patent portal, splenic, hepatic and renal veins. No pathologically  enlarged lymph nodes in the abdomen or pelvis. Reproductive: Myomatous uterus with coarsely calcified degenerated small uterine fibroids measuring up to 2.6 cm. No adnexal masses. Other: No pneumoperitoneum, ascites or focal fluid collection. Musculoskeletal: No aggressive appearing focal osseous lesions. Moderate thoracolumbar spondylosis. IMPRESSION: 1. No acute abnormality. No evidence of bowel obstruction or acute bowel inflammation. Normal appendix. Marked diffuse colonic diverticulosis, with no evidence of acute diverticulitis. 2. Indeterminate hypodense exophytic segment 4B left liver lobe 2.3 cm mass. MRI abdomen without and with IV contrast recommended for further characterization, which can be performed on a short term outpatient basis. 3. Small hiatal hernia. 4. Myomatous uterus with degenerated calcified fibroids. 5.  Aortic Atherosclerosis (ICD10-I70.0). Electronically Signed   By: Ilona Sorrel M.D.   On: 10/10/2019 16:57    Procedures Procedures (including critical care time)  Medications Ordered in ED Medications  morphine 2 MG/ML injection 2 mg (2 mg Intravenous Refused 10/10/19 1526)  sodium chloride 0.9 % bolus 1,000 mL (0 mLs Intravenous Stopped 10/10/19 1545)  ondansetron (ZOFRAN) injection 4 mg (4 mg Intravenous Given 10/10/19 1437)  iohexol (OMNIPAQUE) 300 MG/ML solution 100 mL (100 mLs Intravenous Contrast Given 10/10/19 1634)    ED Course  I have reviewed the triage vital signs and the nursing notes.  Pertinent labs & imaging results that were available during my care of the patient were reviewed by me and considered in my medical decision making (see chart for details).    MDM Rules/Calculators/A&P                      64 year old female past medical 3 of diverticulitis, hypertension, diabetes who presents for evaluation of abdominal pain.  Worsened today at about 1230 and is localized to right flank and right lower quadrant.  No fevers, nausea/vomiting. Patient is  afebrile, non-toxic appearing, sitting comfortably on examination table. Vital signs reviewed and stable.  Concern for infectious etiology such as diverticulitis but also consider appendicitis given the location.  Consider GU etiology versus kidney stone.  Plan to check labs.  CBC shows slight leukopenia.  She has had this previously.  Otherwise unremarkable.  CMP is unremarkable.  Lipase within normal limits.  Urine negative for any infectious etiology.  CT scan shows no acute abnormality.  No evidence of appendix.  She has diffuse diverticulosis but no evidence of diverticulitis.  There is mention of an indeterminate hypodense segment in the liver lobe that is about 2.3 cm mass.  Recommend MRI abdomen with and without IV contrast for further characterization that can be performed on outpatient basis.  Otherwise unremarkable.  I discussed results with patient.  I discussed with her the need for follow-up with her primary care doctor regarding this abnormality seen in her liver.  Patient understands importance of following up.  Given her leukopenia and vague symptoms, will plan to do outpatient Covid test.  Patient is agreeable. At this time, patient exhibits no emergent life-threatening condition that require further evaluation in ED or admission. Patient had ample opportunity for questions and discussion. All patient's questions were answered with full understanding. Strict return precautions discussed. Patient expresses understanding and agreement to plan.   Portions of this note were generated with [employer]. Dictation errors may occur despite best attempts at proofreading.  Final Clinical Impression(s) / ED Diagnoses Final diagnoses:  Generalized abdominal pain  Liver mass    Rx / DC Orders ED Discharge Orders    None       Desma Mcgregor 10/10/19 2137    Veryl Speak, MD 10/11/19 [phone]

## 2019-10-11 LAB — SARS CORONAVIRUS 2 (TAT 6-24 HRS): SARS Coronavirus 2: NEGATIVE

## 2019-10-22 DIAGNOSIS — R160 Hepatomegaly, not elsewhere classified: Secondary | ICD-10-CM | POA: Diagnosis not present

## 2019-10-22 DIAGNOSIS — K7689 Other specified diseases of liver: Secondary | ICD-10-CM | POA: Diagnosis not present

## 2019-10-22 DIAGNOSIS — K8020 Calculus of gallbladder without cholecystitis without obstruction: Secondary | ICD-10-CM | POA: Diagnosis not present

## 2019-11-08 ENCOUNTER — Encounter: Payer: Self-pay | Admitting: General Practice

## 2019-11-22 DIAGNOSIS — K8050 Calculus of bile duct without cholangitis or cholecystitis without obstruction: Secondary | ICD-10-CM | POA: Diagnosis not present

## 2019-11-22 DIAGNOSIS — Z79899 Other long term (current) drug therapy: Secondary | ICD-10-CM | POA: Diagnosis not present

## 2019-11-22 DIAGNOSIS — K8020 Calculus of gallbladder without cholecystitis without obstruction: Secondary | ICD-10-CM | POA: Diagnosis not present

## 2019-11-22 DIAGNOSIS — Z6841 Body Mass Index (BMI) 40.0 and over, adult: Secondary | ICD-10-CM | POA: Diagnosis not present

## 2019-11-22 DIAGNOSIS — E119 Type 2 diabetes mellitus without complications: Secondary | ICD-10-CM | POA: Diagnosis not present

## 2019-11-22 DIAGNOSIS — I10 Essential (primary) hypertension: Secondary | ICD-10-CM | POA: Diagnosis not present

## 2019-11-22 DIAGNOSIS — Z7984 Long term (current) use of oral hypoglycemic drugs: Secondary | ICD-10-CM | POA: Diagnosis not present

## 2019-12-03 DIAGNOSIS — Z01812 Encounter for preprocedural laboratory examination: Secondary | ICD-10-CM | POA: Diagnosis not present

## 2019-12-03 DIAGNOSIS — K8020 Calculus of gallbladder without cholecystitis without obstruction: Secondary | ICD-10-CM | POA: Diagnosis not present

## 2019-12-03 DIAGNOSIS — Z20822 Contact with and (suspected) exposure to covid-19: Secondary | ICD-10-CM | POA: Diagnosis not present

## 2019-12-09 DIAGNOSIS — I10 Essential (primary) hypertension: Secondary | ICD-10-CM | POA: Diagnosis not present

## 2019-12-09 DIAGNOSIS — K8020 Calculus of gallbladder without cholecystitis without obstruction: Secondary | ICD-10-CM | POA: Diagnosis not present

## 2019-12-09 DIAGNOSIS — R160 Hepatomegaly, not elsewhere classified: Secondary | ICD-10-CM | POA: Diagnosis not present

## 2019-12-09 DIAGNOSIS — K811 Chronic cholecystitis: Secondary | ICD-10-CM | POA: Diagnosis not present

## 2019-12-09 DIAGNOSIS — K824 Cholesterolosis of gallbladder: Secondary | ICD-10-CM | POA: Diagnosis not present

## 2019-12-09 DIAGNOSIS — K7689 Other specified diseases of liver: Secondary | ICD-10-CM | POA: Diagnosis not present

## 2019-12-09 DIAGNOSIS — Z6841 Body Mass Index (BMI) 40.0 and over, adult: Secondary | ICD-10-CM | POA: Diagnosis not present

## 2019-12-09 DIAGNOSIS — Z79899 Other long term (current) drug therapy: Secondary | ICD-10-CM | POA: Diagnosis not present

## 2019-12-24 DIAGNOSIS — Z9049 Acquired absence of other specified parts of digestive tract: Secondary | ICD-10-CM | POA: Diagnosis not present

## 2019-12-24 DIAGNOSIS — E119 Type 2 diabetes mellitus without complications: Secondary | ICD-10-CM | POA: Diagnosis not present

## 2020-01-02 DIAGNOSIS — D101 Benign neoplasm of tongue: Secondary | ICD-10-CM | POA: Diagnosis not present

## 2020-01-08 DIAGNOSIS — Z01812 Encounter for preprocedural laboratory examination: Secondary | ICD-10-CM | POA: Diagnosis not present

## 2020-01-08 DIAGNOSIS — Z20822 Contact with and (suspected) exposure to covid-19: Secondary | ICD-10-CM | POA: Diagnosis not present

## 2020-01-08 DIAGNOSIS — K148 Other diseases of tongue: Secondary | ICD-10-CM | POA: Diagnosis not present

## 2020-01-14 DIAGNOSIS — K148 Other diseases of tongue: Secondary | ICD-10-CM | POA: Diagnosis not present

## 2020-01-14 DIAGNOSIS — D101 Benign neoplasm of tongue: Secondary | ICD-10-CM | POA: Diagnosis not present

## 2020-02-21 DIAGNOSIS — E119 Type 2 diabetes mellitus without complications: Secondary | ICD-10-CM | POA: Diagnosis not present

## 2020-02-21 DIAGNOSIS — R635 Abnormal weight gain: Secondary | ICD-10-CM | POA: Diagnosis not present

## 2020-02-21 DIAGNOSIS — I10 Essential (primary) hypertension: Secondary | ICD-10-CM | POA: Diagnosis not present

## 2020-02-21 DIAGNOSIS — D509 Iron deficiency anemia, unspecified: Secondary | ICD-10-CM | POA: Diagnosis not present

## 2020-02-21 DIAGNOSIS — E785 Hyperlipidemia, unspecified: Secondary | ICD-10-CM | POA: Diagnosis not present

## 2020-04-07 DIAGNOSIS — E119 Type 2 diabetes mellitus without complications: Secondary | ICD-10-CM | POA: Diagnosis not present

## 2020-04-07 DIAGNOSIS — R519 Headache, unspecified: Secondary | ICD-10-CM | POA: Diagnosis not present

## 2020-04-07 DIAGNOSIS — M79672 Pain in left foot: Secondary | ICD-10-CM | POA: Diagnosis not present

## 2020-04-30 ENCOUNTER — Other Ambulatory Visit: Payer: Self-pay | Admitting: Gastroenterology

## 2020-04-30 DIAGNOSIS — K219 Gastro-esophageal reflux disease without esophagitis: Secondary | ICD-10-CM | POA: Diagnosis not present

## 2020-04-30 DIAGNOSIS — I10 Essential (primary) hypertension: Secondary | ICD-10-CM | POA: Diagnosis not present

## 2020-04-30 DIAGNOSIS — E119 Type 2 diabetes mellitus without complications: Secondary | ICD-10-CM | POA: Diagnosis not present

## 2020-04-30 DIAGNOSIS — R0789 Other chest pain: Secondary | ICD-10-CM | POA: Diagnosis not present

## 2020-04-30 DIAGNOSIS — E785 Hyperlipidemia, unspecified: Secondary | ICD-10-CM | POA: Diagnosis not present

## 2020-04-30 DIAGNOSIS — R197 Diarrhea, unspecified: Secondary | ICD-10-CM | POA: Diagnosis not present

## 2020-04-30 DIAGNOSIS — R1310 Dysphagia, unspecified: Secondary | ICD-10-CM

## 2020-04-30 DIAGNOSIS — R079 Chest pain, unspecified: Secondary | ICD-10-CM | POA: Diagnosis not present

## 2020-05-08 ENCOUNTER — Ambulatory Visit
Admission: RE | Admit: 2020-05-08 | Discharge: 2020-05-08 | Disposition: A | Discharge status: Home / Self Care | Payer: Federal, State, Local not specified - PPO | Source: Ambulatory Visit | Attending: Gastroenterology | Admitting: Gastroenterology

## 2020-05-08 DIAGNOSIS — K219 Gastro-esophageal reflux disease without esophagitis: Secondary | ICD-10-CM | POA: Diagnosis not present

## 2020-05-08 DIAGNOSIS — K224 Dyskinesia of esophagus: Secondary | ICD-10-CM | POA: Diagnosis not present

## 2020-05-08 DIAGNOSIS — R1310 Dysphagia, unspecified: Secondary | ICD-10-CM

## 2020-05-13 DIAGNOSIS — F419 Anxiety disorder, unspecified: Secondary | ICD-10-CM | POA: Diagnosis not present

## 2020-05-19 DIAGNOSIS — Z03818 Encounter for observation for suspected exposure to other biological agents ruled out: Secondary | ICD-10-CM | POA: Diagnosis not present

## 2020-05-19 DIAGNOSIS — R05 Cough: Secondary | ICD-10-CM | POA: Diagnosis not present

## 2020-08-03 DIAGNOSIS — E785 Hyperlipidemia, unspecified: Secondary | ICD-10-CM | POA: Diagnosis not present

## 2020-08-03 DIAGNOSIS — R0789 Other chest pain: Secondary | ICD-10-CM | POA: Diagnosis not present

## 2020-08-03 DIAGNOSIS — I10 Essential (primary) hypertension: Secondary | ICD-10-CM | POA: Diagnosis not present

## 2020-08-03 DIAGNOSIS — E119 Type 2 diabetes mellitus without complications: Secondary | ICD-10-CM | POA: Diagnosis not present

## 2020-08-05 DIAGNOSIS — I10 Essential (primary) hypertension: Secondary | ICD-10-CM | POA: Diagnosis not present

## 2020-08-05 DIAGNOSIS — E119 Type 2 diabetes mellitus without complications: Secondary | ICD-10-CM | POA: Diagnosis not present

## 2020-08-05 DIAGNOSIS — E785 Hyperlipidemia, unspecified: Secondary | ICD-10-CM | POA: Diagnosis not present

## 2020-08-21 DIAGNOSIS — Z1231 Encounter for screening mammogram for malignant neoplasm of breast: Secondary | ICD-10-CM | POA: Diagnosis not present

## 2020-09-09 ENCOUNTER — Emergency Department (HOSPITAL_BASED_OUTPATIENT_CLINIC_OR_DEPARTMENT_OTHER): Payer: Federal, State, Local not specified - PPO

## 2020-09-09 ENCOUNTER — Emergency Department (HOSPITAL_BASED_OUTPATIENT_CLINIC_OR_DEPARTMENT_OTHER)
Admission: EM | Admit: 2020-09-09 | Discharge: 2020-09-10 | Disposition: A | Discharge status: Home / Self Care | Payer: Federal, State, Local not specified - PPO | Attending: Emergency Medicine | Admitting: Emergency Medicine

## 2020-09-09 ENCOUNTER — Encounter (HOSPITAL_BASED_OUTPATIENT_CLINIC_OR_DEPARTMENT_OTHER): Payer: Self-pay | Admitting: Emergency Medicine

## 2020-09-09 ENCOUNTER — Other Ambulatory Visit: Payer: Self-pay

## 2020-09-09 DIAGNOSIS — Z794 Long term (current) use of insulin: Secondary | ICD-10-CM | POA: Diagnosis not present

## 2020-09-09 DIAGNOSIS — K449 Diaphragmatic hernia without obstruction or gangrene: Secondary | ICD-10-CM | POA: Diagnosis not present

## 2020-09-09 DIAGNOSIS — L810 Postinflammatory hyperpigmentation: Secondary | ICD-10-CM | POA: Diagnosis not present

## 2020-09-09 DIAGNOSIS — L731 Pseudofolliculitis barbae: Secondary | ICD-10-CM | POA: Diagnosis not present

## 2020-09-09 DIAGNOSIS — E119 Type 2 diabetes mellitus without complications: Secondary | ICD-10-CM | POA: Insufficient documentation

## 2020-09-09 DIAGNOSIS — L309 Dermatitis, unspecified: Secondary | ICD-10-CM | POA: Diagnosis not present

## 2020-09-09 DIAGNOSIS — Z79899 Other long term (current) drug therapy: Secondary | ICD-10-CM | POA: Diagnosis not present

## 2020-09-09 DIAGNOSIS — L680 Hirsutism: Secondary | ICD-10-CM | POA: Diagnosis not present

## 2020-09-09 DIAGNOSIS — I10 Essential (primary) hypertension: Secondary | ICD-10-CM | POA: Insufficient documentation

## 2020-09-09 DIAGNOSIS — R0789 Other chest pain: Secondary | ICD-10-CM | POA: Diagnosis not present

## 2020-09-09 DIAGNOSIS — R079 Chest pain, unspecified: Secondary | ICD-10-CM | POA: Diagnosis not present

## 2020-09-09 LAB — CBC WITH DIFFERENTIAL/PLATELET
Abs Immature Granulocytes: 0.01 10*3/uL (ref 0.00–0.07)
Basophils Absolute: 0 10*3/uL (ref 0.0–0.1)
Basophils Relative: 0 %
Eosinophils Absolute: 0.1 10*3/uL (ref 0.0–0.5)
Eosinophils Relative: 2 %
HCT: 38.8 % (ref 36.0–46.0)
Hemoglobin: 12.8 g/dL (ref 12.0–15.0)
Immature Granulocytes: 0 %
Lymphocytes Relative: 47 %
Lymphs Abs: 1.9 10*3/uL (ref 0.7–4.0)
MCH: 30.7 pg (ref 26.0–34.0)
MCHC: 33 g/dL (ref 30.0–36.0)
MCV: 93 fL (ref 80.0–100.0)
Monocytes Absolute: 0.3 10*3/uL (ref 0.1–1.0)
Monocytes Relative: 8 %
Neutro Abs: 1.7 10*3/uL (ref 1.7–7.7)
Neutrophils Relative %: 43 %
Platelets: 235 10*3/uL (ref 150–400)
RBC: 4.17 MIL/uL (ref 3.87–5.11)
RDW: 13.8 % (ref 11.5–15.5)
WBC: 4 10*3/uL (ref 4.0–10.5)
nRBC: 0 % (ref 0.0–0.2)

## 2020-09-09 NOTE — ED Triage Notes (Signed)
Chest left side down left arm 1.5 hours ago while at work. Pt states has had chest pain off and on for "weeks". Seen by Cardiologist last week. Pt denies other sx. Amb. No SOB.

## 2020-09-09 NOTE — ED Provider Notes (Signed)
[facility] DEPT [facility] Provider Note: Georgena Spurling, MD, FACEP  CSN: [id number] MRN: [id number] ARRIVAL: 09/09/20 at 2254 ROOM: [place]  Chest Pain   HISTORY OF PRESENT ILLNESS  09/09/20 11:24 PM Nichole Harris is a 64 y.o. female who developed chest pain at work about 10 PM today.  The chest pain is described as having both sharp and dull components and located in the upper aspect of her left breast.  Nothing brought it on or made it better, including exertion or movement of the arm or neck.  It is been associated with a burning sensation in her left arm.  The pain occurred intermittently lasting only seconds at a time.  She rates the pain as a 7 out of 10.  The burning in the arm persists but the chest pain is not occurring presently.  She denies any associated shortness of breath, nausea, vomiting or diaphoresis.  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   Past Medical History:  Diagnosis Date  . Anemia   . Diabetes mellitus without complication ([place])   . Diverticulitis   . Hypertension   . Reflux   . Uterine fibroids affecting pregnancy     Past Surgical History:  Procedure Laterality Date  . HAMMER TOE SURGERY    . lumpectomy    . TONSILLECTOMY    . UFE      Family History  Problem Relation Age of Onset  . AAA (abdominal aortic aneurysm) Mother   . AAA (abdominal aortic aneurysm) Father   . Heart disease Father     Social History   Tobacco Use  . Smoking status: Never Smoker  . Smokeless tobacco: Never Used  Substance Use Topics  . Alcohol use: No  . Drug use: No    Prior to Admission medications   Medication Sig Start Date End Date Taking? Authorizing Provider  atorvastatin (LIPITOR) 20 MG tablet Take 1 tablet (20 mg total) by mouth daily. 07/03/19 06/27/20  Buford Dresser, MD  glipiZIDE (GLUCOTROL XL) 5 MG 24 hr tablet Take 1 tablet by mouth daily. 03/20/19   [provider]  losartan (COZAAR) 25 MG tablet Take  25 mg by mouth daily. 08/05/20   [provider]  Multiple Vitamins-Minerals (MULTIVITAMIN ADULT PO) Take by mouth.    [provider]  rosuvastatin (CRESTOR) 5 MG tablet Take by mouth. 08/25/20   [provider]  Esomeprazole Magnesium (NEXIUM PO) Take by mouth daily.  09/10/20  [provider]  metFORMIN (GLUCOPHAGE) 500 MG tablet Take 500 mg by mouth daily with breakfast.  09/10/20  [provider]  valsartan (DIOVAN) 40 MG tablet Take 1 tablet (40 mg total) by mouth daily. 07/03/19 09/10/20  Buford Dresser, MD    Allergies Patient has no known allergies.   REVIEW OF SYSTEMS  Negative except as noted here or in the History of Present Illness.   PHYSICAL EXAMINATION  Initial Vital Signs Blood pressure (!) 148/80, pulse 74, temperature 97.6 F (36.4 C), temperature source Oral, resp. rate 20, height 6' (1.829 m), weight 127 kg, SpO2 100 %.  Examination General: Well-developed, well-nourished female in no acute distress; appearance consistent with age of record HENT: normocephalic; atraumatic Eyes: pupils equal, round and reactive to light; extraocular muscles intact; arcus senilis bilaterally Neck: supple Heart: regular rate and rhythm Lungs: clear to auscultation bilaterally Abdomen: soft; nondistended; nontender; bowel sounds present Extremities: No deformity; full range of motion; pulses normal Neurologic: Awake, alert and  oriented; motor function intact in all extremities and symmetric; no facial droop Skin: Warm and dry Psychiatric: Normal mood and affect   RESULTS  Summary of this visit's results, reviewed and interpreted by myself:   EKG Interpretation  Date/Time:  Wednesday September 09 2020 23:34:32 EST Ventricular Rate:  65 PR Interval:    QRS Duration: 97 QT Interval:  389 QTC Calculation: 405 R Axis:   2 Text Interpretation: Sinus rhythm Normal ECG No significant change was found Confirmed by Shanon Rosser  [phone]) on 09/09/2020 11:38:19 PM      Laboratory Studies: Results for orders placed or performed during the hospital encounter of 09/09/20 (from the past 24 hour(s))  Basic metabolic panel     Status: Abnormal   Collection Time: 09/09/20 11:47 PM  Result Value Ref Range   Sodium 139 135 - 145 mmol/L   Potassium 4.0 3.5 - 5.1 mmol/L   Chloride 104 98 - 111 mmol/L   CO2 25 22 - 32 mmol/L   Glucose, Bld 105 (H) 70 - 99 mg/dL   BUN 15 8 - 23 mg/dL   Creatinine, Ser 0.74 0.44 - 1.00 mg/dL   Calcium 10.3 8.9 - 10.3 mg/dL   GFR, Estimated >60 >60 mL/min   Anion gap 10 5 - 15  Troponin I (High Sensitivity)     Status: None   Collection Time: 09/09/20 11:47 PM  Result Value Ref Range   Troponin I (High Sensitivity) 4 <18 ng/L  CBC with Differential/Platelet     Status: None   Collection Time: 09/09/20 11:47 PM  Result Value Ref Range   WBC 4.0 4.0 - 10.5 K/uL   RBC 4.17 3.87 - 5.11 MIL/uL   Hemoglobin 12.8 12.0 - 15.0 g/dL   HCT 38.8 36.0 - 46.0 %   MCV 93.0 80.0 - 100.0 fL   MCH 30.7 26.0 - 34.0 pg   MCHC 33.0 30.0 - 36.0 g/dL   RDW 13.8 11.5 - 15.5 %   Platelets 235 150 - 400 K/uL   nRBC 0.0 0.0 - 0.2 %   Neutrophils Relative % 43 %   Neutro Abs 1.7 1.7 - 7.7 K/uL   Lymphocytes Relative 47 %   Lymphs Abs 1.9 0.7 - 4.0 K/uL   Monocytes Relative 8 %   Monocytes Absolute 0.3 0.1 - 1.0 K/uL   Eosinophils Relative 2 %   Eosinophils Absolute 0.1 0.0 - 0.5 K/uL   Basophils Relative 0 %   Basophils Absolute 0.0 0.0 - 0.1 K/uL   Immature Granulocytes 0 %   Abs Immature Granulocytes 0.01 0.00 - 0.07 K/uL  Troponin I (High Sensitivity)     Status: None   Collection Time: 09/10/20  1:18 AM  Result Value Ref Range   Troponin I (High Sensitivity) 4 <18 ng/L   Imaging Studies: DG Chest 2 View  Result Date: 09/09/2020 CLINICAL DATA:  Chest pain. Left-sided chest pain radiating down left arm. Intermittent pain for weeks. EXAM: CHEST - 2 VIEW COMPARISON:  04/16/2015 FINDINGS: The  cardiomediastinal contours are normal. The lungs are clear. Pulmonary vasculature is normal. No consolidation, pleural effusion, or pneumothorax. Small hiatal hernia. No acute osseous abnormalities are seen. IMPRESSION: No acute chest findings. Small hiatal hernia. Electronically Signed   By: Keith Rake M.D.   On: 09/09/2020 23:33    ED COURSE and MDM  Nursing notes, initial and subsequent vitals signs, including pulse oximetry, reviewed and interpreted by myself.  Vitals:   09/10/20 0100 09/10/20 0130 09/10/20  0200 09/10/20 0230  BP: (!) 156/88 (!) 157/91 (!) 157/86 (!) 158/81  Pulse: 72 62 (!) 139 62  Resp: 14 14 13 13   Temp:      TempSrc:      SpO2: 99% 100% (!) 80% 100%  Weight:      Height:       Medications - No data to display  2:52 AM Patient asymptomatic and has had no further chest pain in the ED.  Her troponins are both normal with no delta.  Her presentation is atypical for cardiac chest pain.  She does have a PCP with whom she can follow-up.   PROCEDURES  Procedures   ED DIAGNOSES     ICD-10-CM   1. Atypical chest pain  R07.89        Shanon Rosser, MD 09/10/20 [phone]

## 2020-09-10 LAB — BASIC METABOLIC PANEL
Anion gap: 10 (ref 5–15)
BUN: 15 mg/dL (ref 8–23)
CO2: 25 mmol/L (ref 22–32)
Calcium: 10.3 mg/dL (ref 8.9–10.3)
Chloride: 104 mmol/L (ref 98–111)
Creatinine, Ser: 0.74 mg/dL (ref 0.44–1.00)
GFR, Estimated: >60 mL/min (ref >60)
Glucose, Bld: 105 mg/dL — ABNORMAL HIGH (ref 70–99)
Potassium: 4 mmol/L (ref 3.5–5.1)
Sodium: 139 mmol/L (ref 135–145)

## 2020-09-10 LAB — TROPONIN I (HIGH SENSITIVITY)
Troponin I (High Sensitivity): 4 ng/L (ref <18)
Troponin I (High Sensitivity): 4 ng/L (ref <18)

## 2020-09-15 DIAGNOSIS — E785 Hyperlipidemia, unspecified: Secondary | ICD-10-CM | POA: Diagnosis not present

## 2020-09-15 DIAGNOSIS — I10 Essential (primary) hypertension: Secondary | ICD-10-CM | POA: Diagnosis not present

## 2020-09-15 DIAGNOSIS — E119 Type 2 diabetes mellitus without complications: Secondary | ICD-10-CM | POA: Diagnosis not present

## 2020-10-09 DIAGNOSIS — Z1159 Encounter for screening for other viral diseases: Secondary | ICD-10-CM | POA: Diagnosis not present

## 2020-10-09 DIAGNOSIS — Z20822 Contact with and (suspected) exposure to covid-19: Secondary | ICD-10-CM | POA: Diagnosis not present

## 2020-10-09 DIAGNOSIS — E785 Hyperlipidemia, unspecified: Secondary | ICD-10-CM | POA: Diagnosis not present

## 2020-12-11 ENCOUNTER — Other Ambulatory Visit: Payer: Self-pay | Admitting: Cardiology

## 2020-12-11 DIAGNOSIS — I10 Essential (primary) hypertension: Secondary | ICD-10-CM

## 2021-01-06 DIAGNOSIS — I10 Essential (primary) hypertension: Secondary | ICD-10-CM | POA: Diagnosis not present

## 2021-01-06 DIAGNOSIS — D72819 Decreased white blood cell count, unspecified: Secondary | ICD-10-CM | POA: Diagnosis not present

## 2021-01-06 DIAGNOSIS — E785 Hyperlipidemia, unspecified: Secondary | ICD-10-CM | POA: Diagnosis not present

## 2021-01-06 DIAGNOSIS — E119 Type 2 diabetes mellitus without complications: Secondary | ICD-10-CM | POA: Diagnosis not present

## 2021-01-13 IMAGING — CT CT ABD-PELV W/ CM
2 of 5 series · 15 of 46 positions shown, 17 images · IV contrast (Omnipaque)
Comparison: None.

CLINICAL DATA: Diffuse abdominal pain for 6 days.

EXAM:
CT ABDOMEN AND PELVIS WITH CONTRAST
TECHNIQUE: Multidetector CT imaging of the abdomen and pelvis was performed
using the standard protocol following bolus administration of
intravenous contrast.
CONTRAST:  100mL OMNIPAQUE IOHEXOL 300 MG/ML  SOLN

[Series 2: axial st · axial · 0.76mm/px · z∈[-742,-338]mm · 12 of 91 slices shown, 14 images]
[im 5/91  soft-tissue]
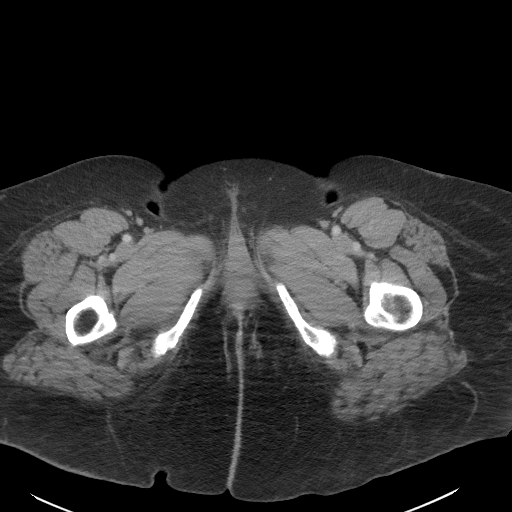
[im 5/91  bone]
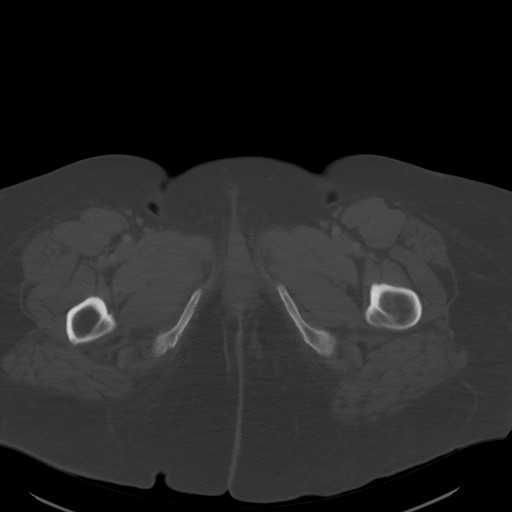
[im 15/91  soft-tissue]
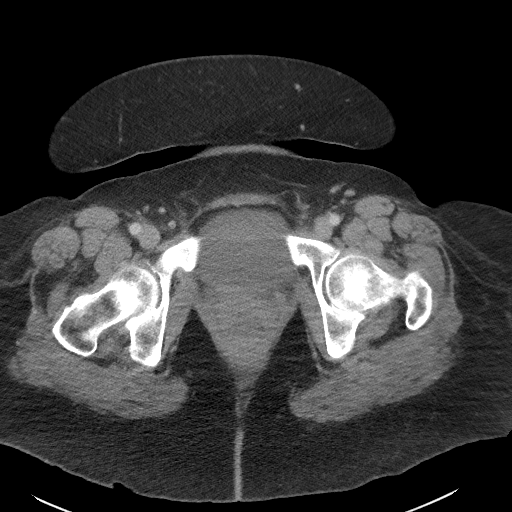
[im 19/91  soft-tissue]
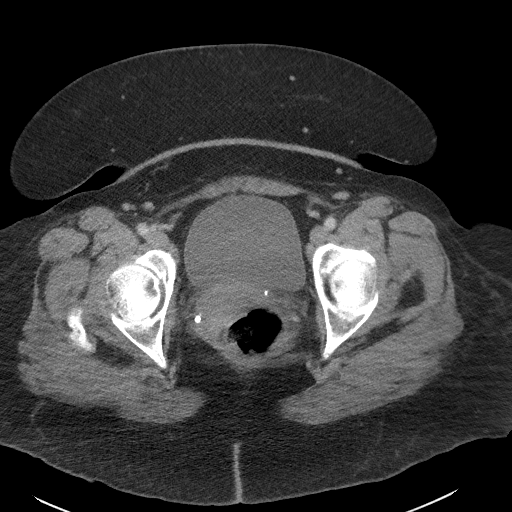
[im 29/91  soft-tissue]
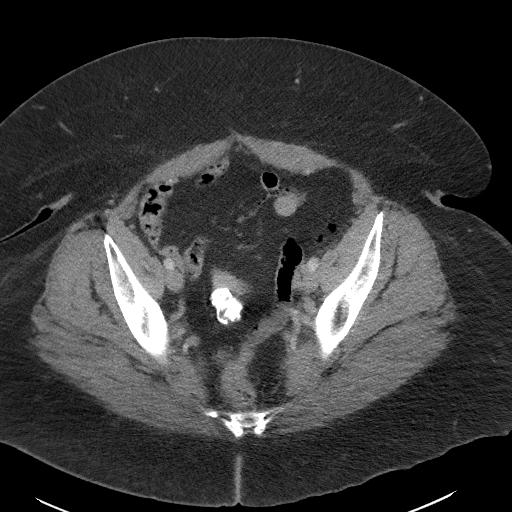
[im 34/91  soft-tissue]
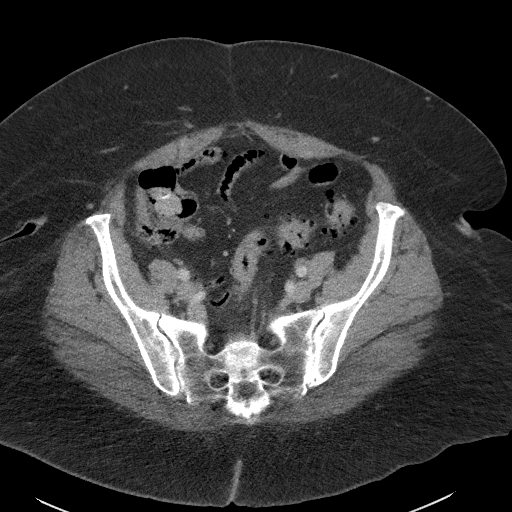
[im 43/91  soft-tissue]
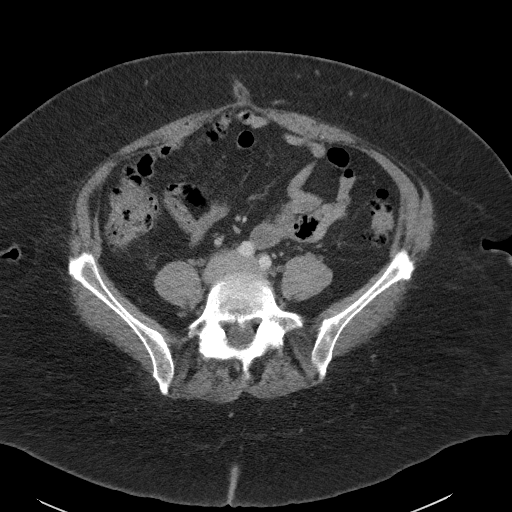
[im 48/91  soft-tissue]
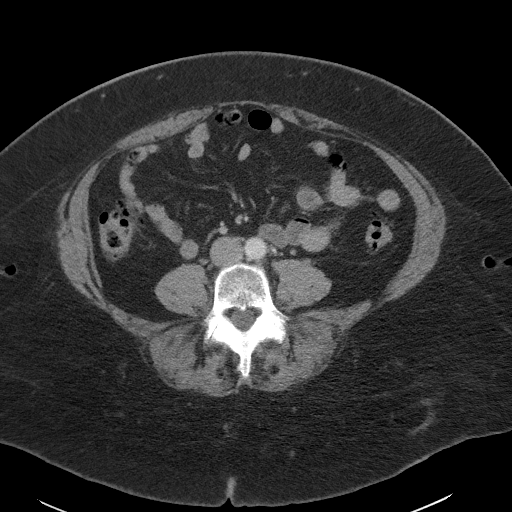
[im 57/91  soft-tissue]
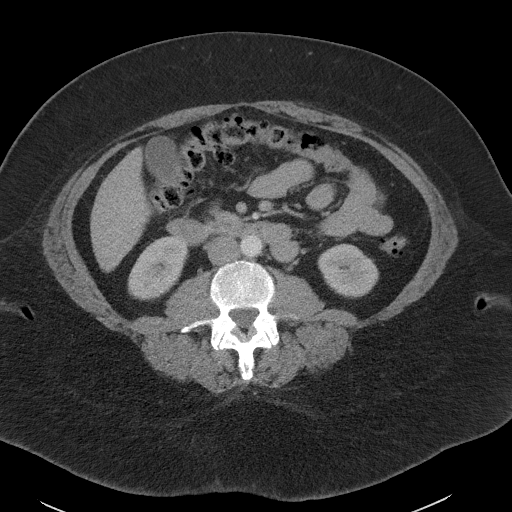
[im 62/91  soft-tissue]
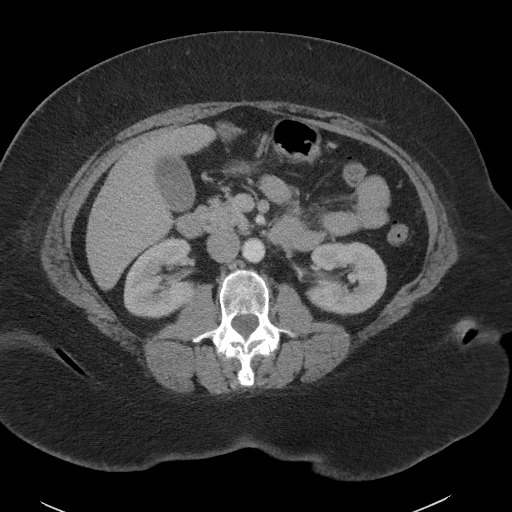
[im 62/91  bone]
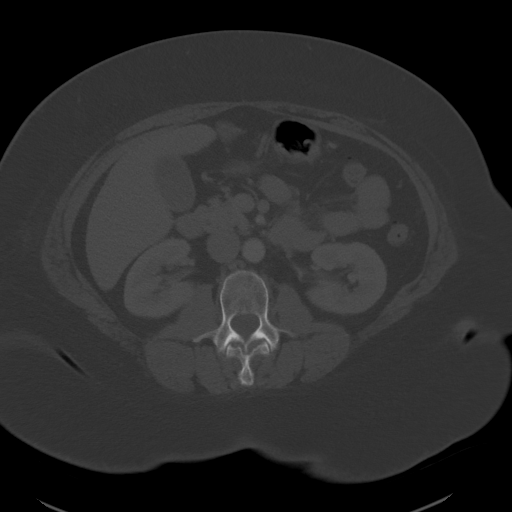
[im 72/91  soft-tissue]
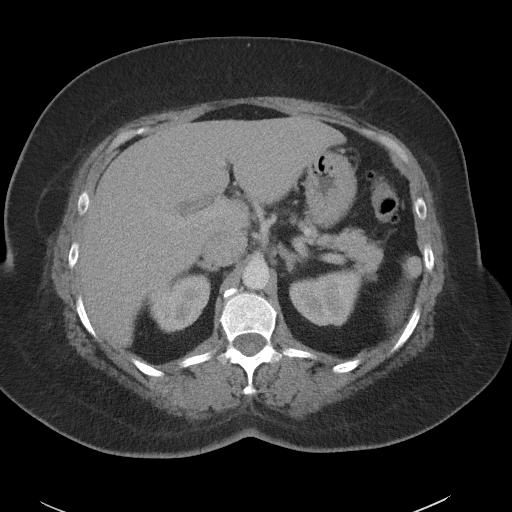
[im 76/91  soft-tissue]
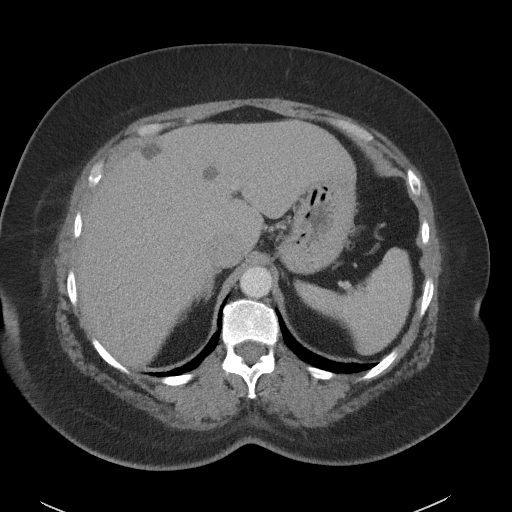
[im 86/91  soft-tissue]
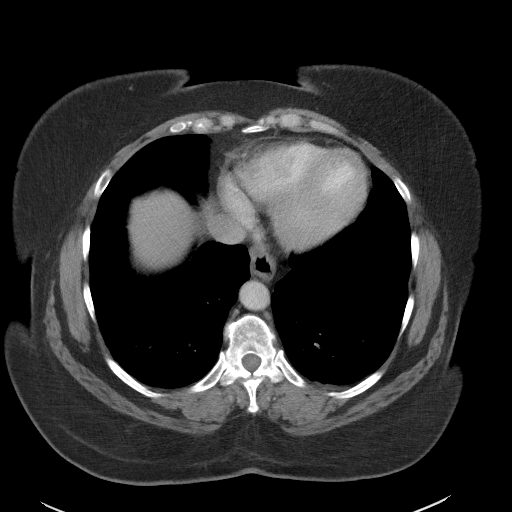

[Series 5: coronal st · coronal · 0.71mm/px · 3 of 101 slices shown]
[im 34/101  soft-tissue]
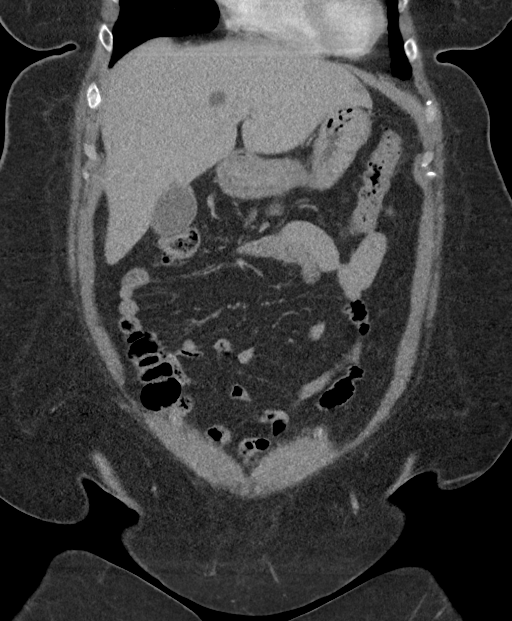
[im 45/101  soft-tissue]
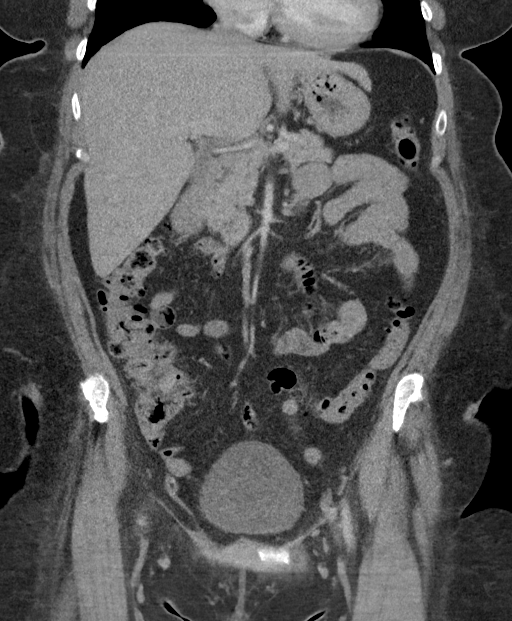
[im 56/101  soft-tissue]
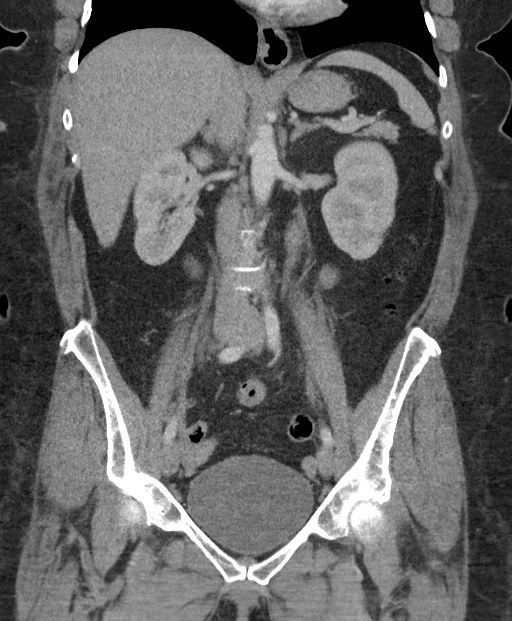

[15 of 46 positions shown; findings below may reference images not displayed]

FINDINGS: Lower chest: No significant pulmonary nodules or acute consolidative
airspace disease.

Hepatobiliary: Normal liver size. Exophytic segment 4B left liver
lobe 2.3 cm hypodense mass (series 2/image 28). A few scattered
simple liver cysts, largest 2.1 cm in the posteroinferior right
liver lobe. Additional scattered subcentimeter hypodense liver
lesions are too small to characterize. Normal gallbladder with no
radiopaque cholelithiasis. No biliary ductal dilatation.

Pancreas: Normal, with no mass or duct dilation.

Spleen: Normal size. No mass.

Adrenals/Urinary Tract: Normal adrenals. Normal size kidneys.
Symmetric normal contrast nephrograms. Subcentimeter hypodense renal
cortical lesion in the posterior interpolar left kidney is too small
to characterize and requires no follow-up. No hydronephrosis. Normal
bladder.

Stomach/Bowel: Small hiatal hernia. Otherwise normal nondistended
stomach. Normal caliber small bowel with no small bowel wall
thickening. Normal appendix. Marked diffuse colonic diverticulosis,
with no large bowel wall thickening or significant pericolonic fat
stranding.

Vascular/Lymphatic: Atherosclerotic nonaneurysmal abdominal aorta.
Patent portal, splenic, hepatic and renal veins. No pathologically
enlarged lymph nodes in the abdomen or pelvis.

Reproductive: Myomatous uterus with coarsely calcified degenerated
small uterine fibroids measuring up to 2.6 cm. No adnexal masses.

Other: No pneumoperitoneum, ascites or focal fluid collection.

Musculoskeletal: No aggressive appearing focal osseous lesions.
Moderate thoracolumbar spondylosis.
IMPRESSION: 1. No acute abnormality. No evidence of bowel obstruction or acute
bowel inflammation. Normal appendix. Marked diffuse colonic
diverticulosis, with no evidence of acute diverticulitis.
2. Indeterminate hypodense exophytic segment 4B left liver lobe
cm mass. MRI abdomen without and with IV contrast recommended for
further characterization, which can be performed on a short term
outpatient basis.
3. Small hiatal hernia.
4. Myomatous uterus with degenerated calcified fibroids.
5.  Aortic Atherosclerosis ([id number]).

## 2021-01-14 DIAGNOSIS — R0981 Nasal congestion: Secondary | ICD-10-CM | POA: Diagnosis not present

## 2021-01-14 DIAGNOSIS — D101 Benign neoplasm of tongue: Secondary | ICD-10-CM | POA: Diagnosis not present

## 2021-01-25 DIAGNOSIS — M205X2 Other deformities of toe(s) (acquired), left foot: Secondary | ICD-10-CM | POA: Diagnosis not present

## 2021-01-25 DIAGNOSIS — G629 Polyneuropathy, unspecified: Secondary | ICD-10-CM | POA: Diagnosis not present

## 2021-01-25 DIAGNOSIS — M66872 Spontaneous rupture of other tendons, left ankle and foot: Secondary | ICD-10-CM | POA: Diagnosis not present

## 2021-01-25 DIAGNOSIS — M792 Neuralgia and neuritis, unspecified: Secondary | ICD-10-CM | POA: Diagnosis not present

## 2021-01-25 DIAGNOSIS — E139 Other specified diabetes mellitus without complications: Secondary | ICD-10-CM | POA: Diagnosis not present

## 2021-03-04 DIAGNOSIS — E1140 Type 2 diabetes mellitus with diabetic neuropathy, unspecified: Secondary | ICD-10-CM | POA: Diagnosis not present

## 2021-03-04 DIAGNOSIS — M21072 Valgus deformity, not elsewhere classified, left ankle: Secondary | ICD-10-CM | POA: Diagnosis not present

## 2021-03-04 DIAGNOSIS — M205X2 Other deformities of toe(s) (acquired), left foot: Secondary | ICD-10-CM | POA: Diagnosis not present

## 2021-03-04 DIAGNOSIS — M21071 Valgus deformity, not elsewhere classified, right ankle: Secondary | ICD-10-CM | POA: Diagnosis not present

## 2021-03-04 DIAGNOSIS — M792 Neuralgia and neuritis, unspecified: Secondary | ICD-10-CM | POA: Diagnosis not present

## 2021-03-19 DIAGNOSIS — E119 Type 2 diabetes mellitus without complications: Secondary | ICD-10-CM | POA: Diagnosis not present

## 2021-05-04 DIAGNOSIS — D72819 Decreased white blood cell count, unspecified: Secondary | ICD-10-CM | POA: Diagnosis not present

## 2021-05-04 DIAGNOSIS — E119 Type 2 diabetes mellitus without complications: Secondary | ICD-10-CM | POA: Diagnosis not present

## 2021-05-04 DIAGNOSIS — Z23 Encounter for immunization: Secondary | ICD-10-CM | POA: Diagnosis not present

## 2021-05-04 DIAGNOSIS — E785 Hyperlipidemia, unspecified: Secondary | ICD-10-CM | POA: Diagnosis not present

## 2021-05-04 DIAGNOSIS — I10 Essential (primary) hypertension: Secondary | ICD-10-CM | POA: Diagnosis not present

## 2021-05-18 DIAGNOSIS — R0789 Other chest pain: Secondary | ICD-10-CM | POA: Diagnosis not present

## 2021-05-18 DIAGNOSIS — E119 Type 2 diabetes mellitus without complications: Secondary | ICD-10-CM | POA: Diagnosis not present

## 2021-05-18 DIAGNOSIS — I10 Essential (primary) hypertension: Secondary | ICD-10-CM | POA: Diagnosis not present

## 2021-05-18 DIAGNOSIS — E785 Hyperlipidemia, unspecified: Secondary | ICD-10-CM | POA: Diagnosis not present

## 2021-05-20 DIAGNOSIS — E119 Type 2 diabetes mellitus without complications: Secondary | ICD-10-CM | POA: Diagnosis not present

## 2021-05-20 DIAGNOSIS — H40023 Open angle with borderline findings, high risk, bilateral: Secondary | ICD-10-CM | POA: Diagnosis not present

## 2021-06-08 DIAGNOSIS — H40011 Open angle with borderline findings, low risk, right eye: Secondary | ICD-10-CM | POA: Diagnosis not present

## 2021-06-09 DIAGNOSIS — G44219 Episodic tension-type headache, not intractable: Secondary | ICD-10-CM | POA: Diagnosis not present

## 2021-06-29 DIAGNOSIS — R202 Paresthesia of skin: Secondary | ICD-10-CM | POA: Diagnosis not present

## 2021-06-29 DIAGNOSIS — I10 Essential (primary) hypertension: Secondary | ICD-10-CM | POA: Diagnosis not present

## 2021-07-07 DIAGNOSIS — G44219 Episodic tension-type headache, not intractable: Secondary | ICD-10-CM | POA: Diagnosis not present

## 2021-07-07 DIAGNOSIS — R519 Headache, unspecified: Secondary | ICD-10-CM | POA: Diagnosis not present

## 2021-08-02 DIAGNOSIS — H43811 Vitreous degeneration, right eye: Secondary | ICD-10-CM | POA: Diagnosis not present

## 2021-08-23 DIAGNOSIS — Z1231 Encounter for screening mammogram for malignant neoplasm of breast: Secondary | ICD-10-CM | POA: Diagnosis not present

## 2021-09-10 DIAGNOSIS — R635 Abnormal weight gain: Secondary | ICD-10-CM | POA: Diagnosis not present

## 2021-09-10 DIAGNOSIS — D509 Iron deficiency anemia, unspecified: Secondary | ICD-10-CM | POA: Diagnosis not present

## 2021-09-10 DIAGNOSIS — I10 Essential (primary) hypertension: Secondary | ICD-10-CM | POA: Diagnosis not present

## 2021-09-10 DIAGNOSIS — Z0000 Encounter for general adult medical examination without abnormal findings: Secondary | ICD-10-CM | POA: Diagnosis not present

## 2021-09-10 DIAGNOSIS — E119 Type 2 diabetes mellitus without complications: Secondary | ICD-10-CM | POA: Diagnosis not present

## 2021-09-10 DIAGNOSIS — E785 Hyperlipidemia, unspecified: Secondary | ICD-10-CM | POA: Diagnosis not present

## 2021-09-24 DIAGNOSIS — Z01419 Encounter for gynecological examination (general) (routine) without abnormal findings: Secondary | ICD-10-CM | POA: Diagnosis not present

## 2021-11-18 DIAGNOSIS — H43811 Vitreous degeneration, right eye: Secondary | ICD-10-CM | POA: Diagnosis not present

## 2021-11-18 DIAGNOSIS — H43391 Other vitreous opacities, right eye: Secondary | ICD-10-CM | POA: Diagnosis not present

## 2021-11-18 DIAGNOSIS — H33311 Horseshoe tear of retina without detachment, right eye: Secondary | ICD-10-CM | POA: Diagnosis not present

## 2021-12-08 DIAGNOSIS — H33311 Horseshoe tear of retina without detachment, right eye: Secondary | ICD-10-CM | POA: Diagnosis not present

## 2021-12-14 IMAGING — CR DG CHEST 2V
2 series · 2 of 2 positions shown · non-contrast
Comparison: 04/16/2015

CLINICAL DATA: Chest pain. Left-sided chest pain radiating down
left arm. Intermittent pain for weeks.

EXAM:
CHEST - 2 VIEW

[w chest pa]
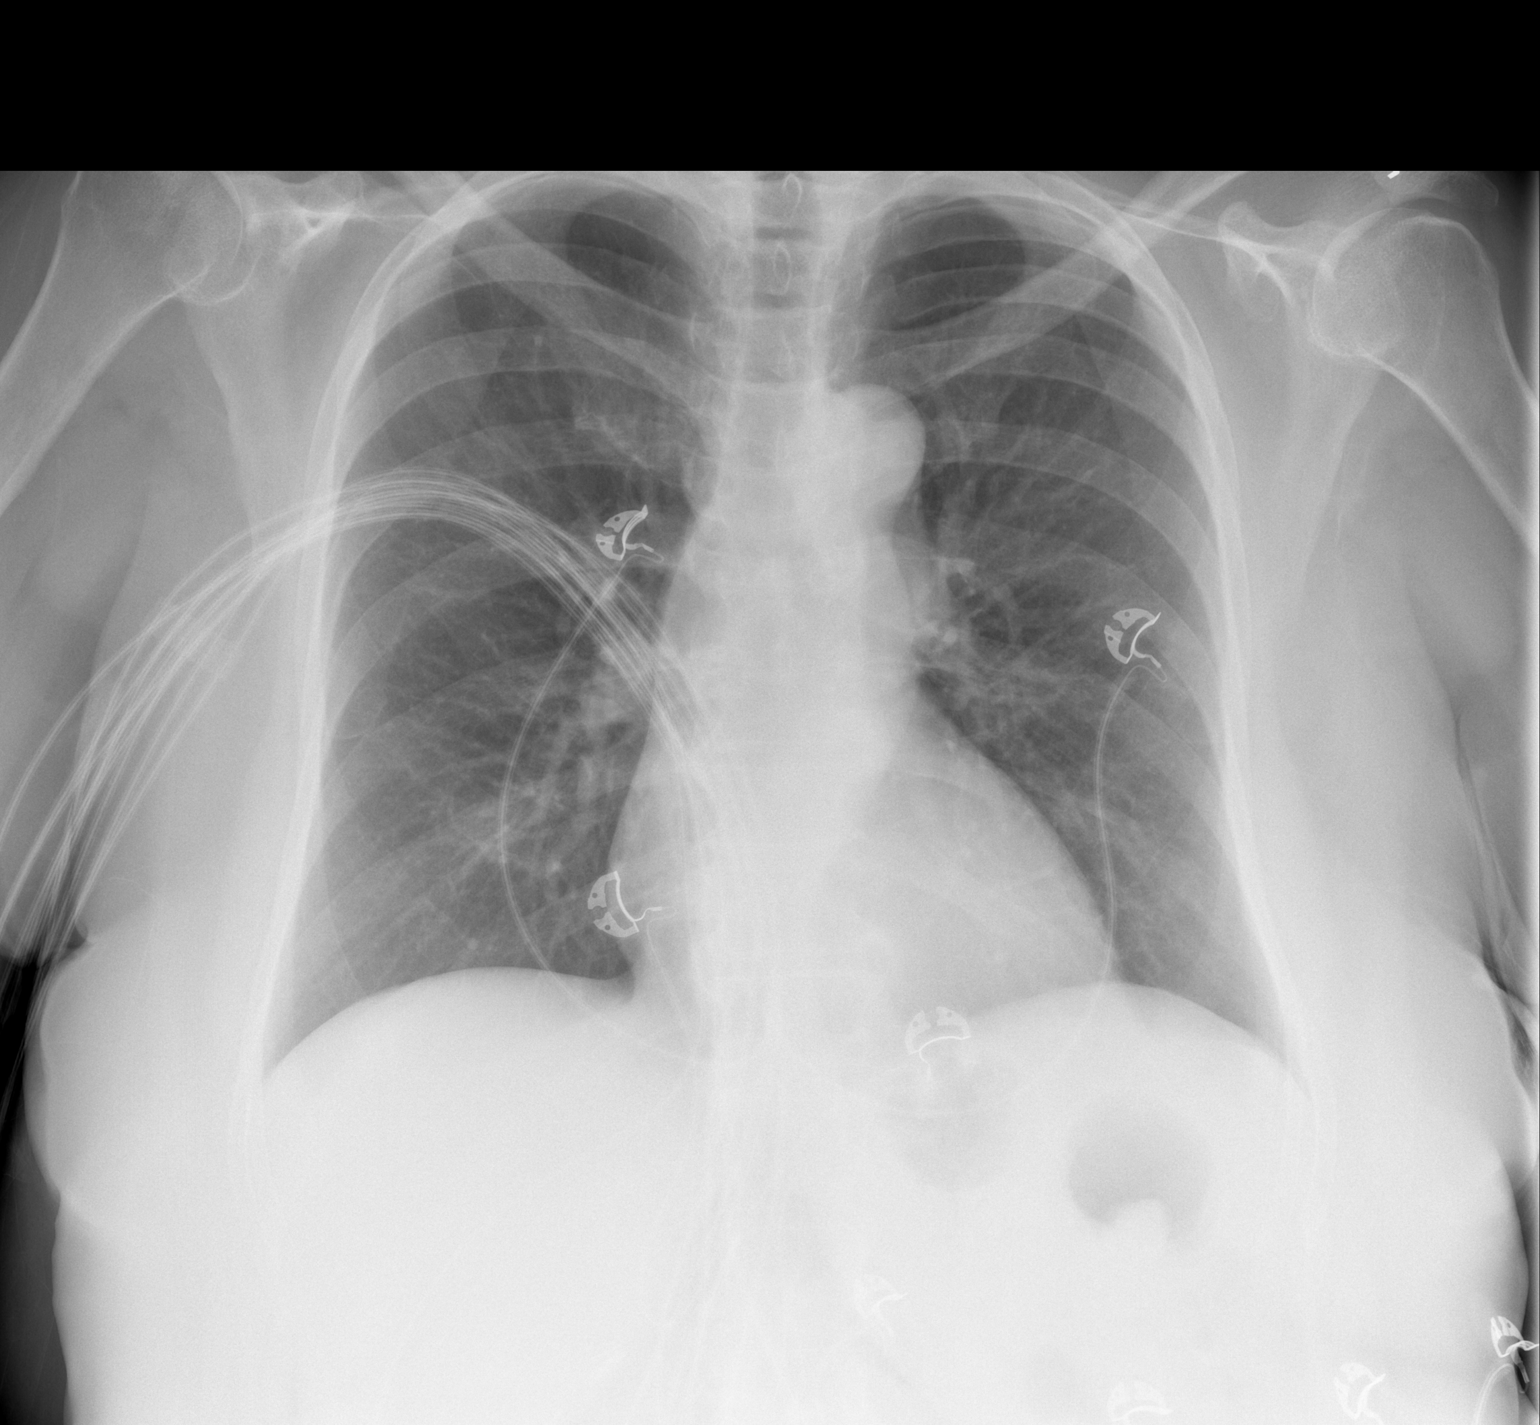

[w chest lat]
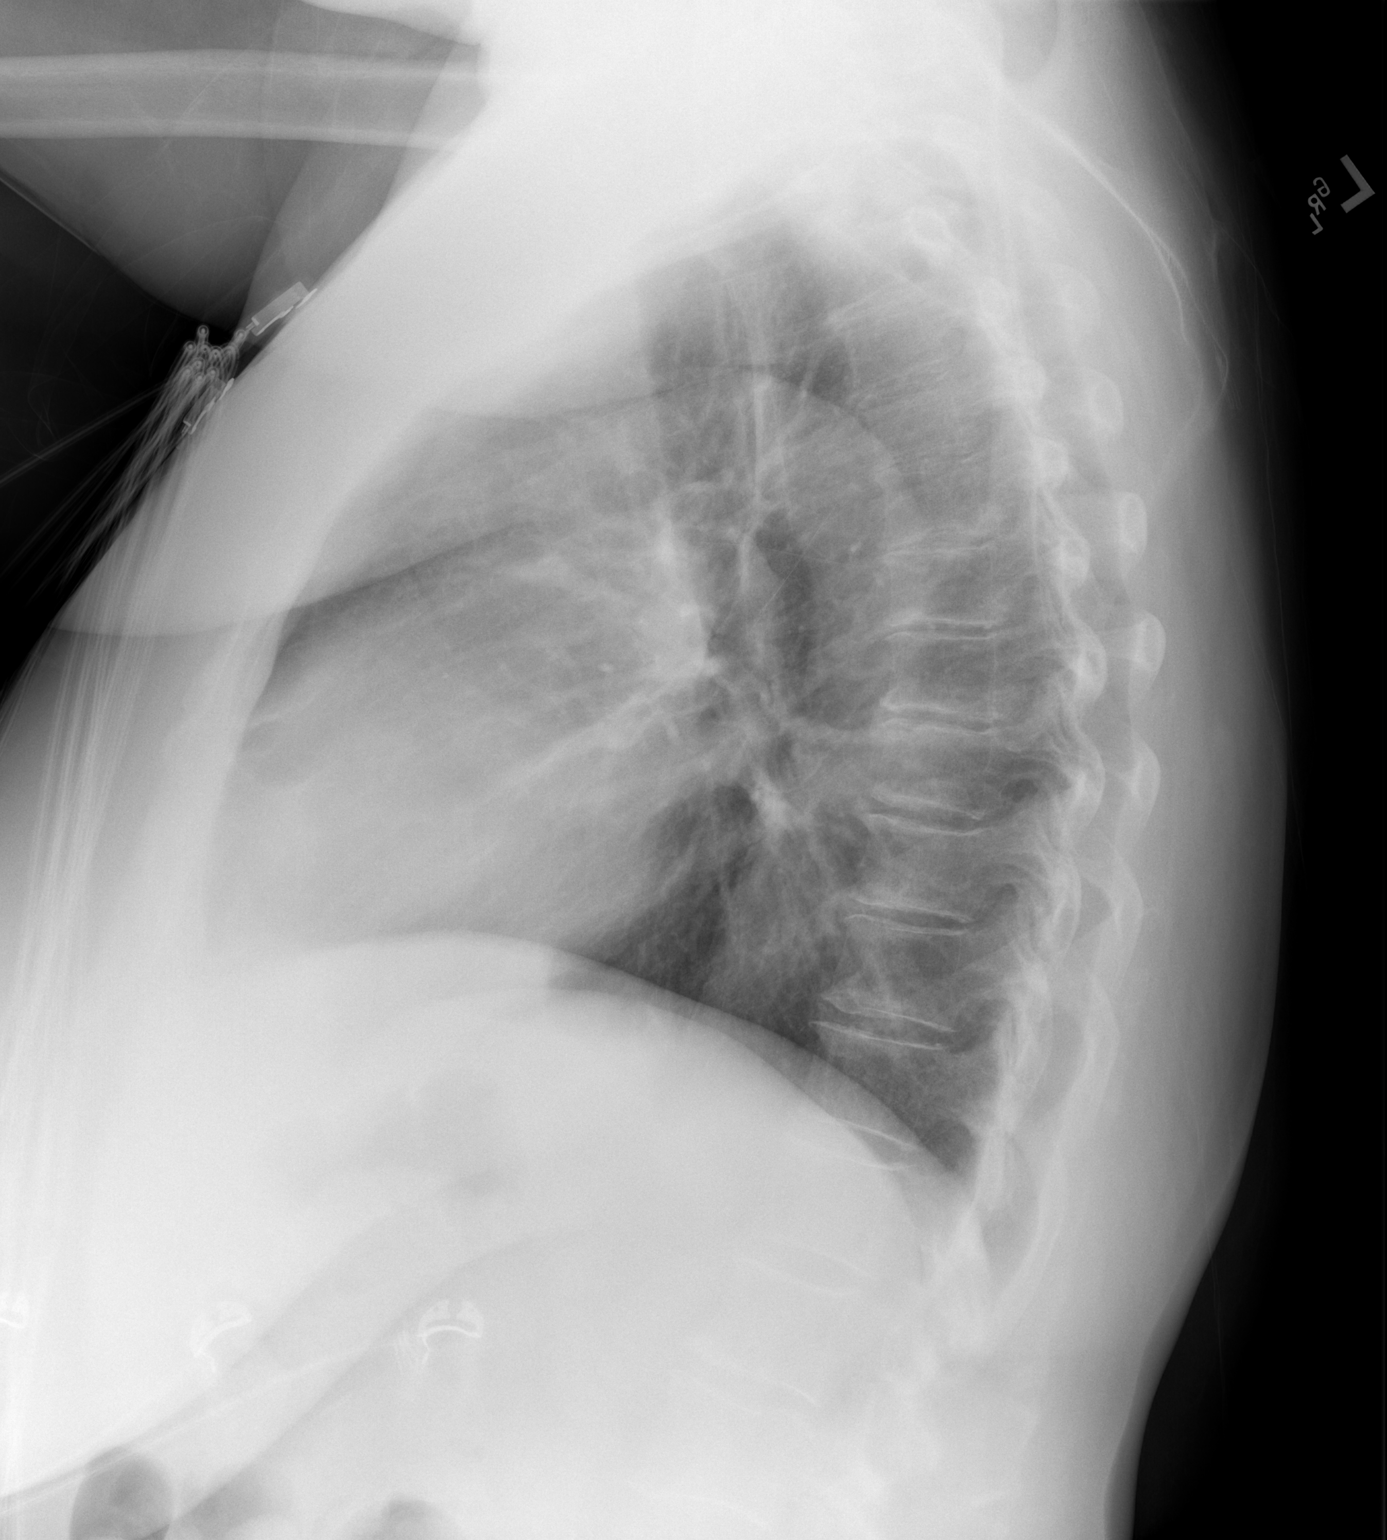

[2 of 2 positions shown; findings below may reference images not displayed]

FINDINGS: The cardiomediastinal contours are normal. The lungs are clear.
Pulmonary vasculature is normal. No consolidation, pleural effusion,
or pneumothorax. Small hiatal hernia. No acute osseous abnormalities
are seen.
IMPRESSION: No acute chest findings. Small hiatal hernia.

## 2021-12-27 DIAGNOSIS — M170 Bilateral primary osteoarthritis of knee: Secondary | ICD-10-CM | POA: Diagnosis not present

## 2021-12-27 DIAGNOSIS — E1100 Type 2 diabetes mellitus with hyperosmolarity without nonketotic hyperglycemic-hyperosmolar coma (NKHHC): Secondary | ICD-10-CM | POA: Diagnosis not present

## 2021-12-27 DIAGNOSIS — M2540 Effusion, unspecified joint: Secondary | ICD-10-CM | POA: Diagnosis not present

## 2022-02-09 DIAGNOSIS — H43811 Vitreous degeneration, right eye: Secondary | ICD-10-CM | POA: Diagnosis not present

## 2022-02-09 DIAGNOSIS — K219 Gastro-esophageal reflux disease without esophagitis: Secondary | ICD-10-CM | POA: Diagnosis not present

## 2022-02-09 DIAGNOSIS — M1990 Unspecified osteoarthritis, unspecified site: Secondary | ICD-10-CM | POA: Diagnosis not present

## 2022-02-09 DIAGNOSIS — E119 Type 2 diabetes mellitus without complications: Secondary | ICD-10-CM | POA: Diagnosis not present

## 2022-02-09 DIAGNOSIS — H31091 Other chorioretinal scars, right eye: Secondary | ICD-10-CM | POA: Diagnosis not present

## 2022-02-09 DIAGNOSIS — H33311 Horseshoe tear of retina without detachment, right eye: Secondary | ICD-10-CM | POA: Diagnosis not present

## 2022-02-09 DIAGNOSIS — I10 Essential (primary) hypertension: Secondary | ICD-10-CM | POA: Diagnosis not present

## 2022-02-16 DIAGNOSIS — E119 Type 2 diabetes mellitus without complications: Secondary | ICD-10-CM | POA: Diagnosis not present

## 2022-02-16 DIAGNOSIS — I10 Essential (primary) hypertension: Secondary | ICD-10-CM | POA: Diagnosis not present

## 2022-02-16 DIAGNOSIS — F419 Anxiety disorder, unspecified: Secondary | ICD-10-CM | POA: Diagnosis not present

## 2022-03-18 DIAGNOSIS — E785 Hyperlipidemia, unspecified: Secondary | ICD-10-CM | POA: Diagnosis not present

## 2022-03-18 DIAGNOSIS — Z79899 Other long term (current) drug therapy: Secondary | ICD-10-CM | POA: Diagnosis not present

## 2022-03-18 DIAGNOSIS — I10 Essential (primary) hypertension: Secondary | ICD-10-CM | POA: Diagnosis not present

## 2022-08-12 ENCOUNTER — Encounter: Payer: Self-pay | Admitting: *Deleted

## 2022-08-12 ENCOUNTER — Telehealth: Payer: Self-pay | Admitting: *Deleted

## 2022-08-12 NOTE — Patient Outreach (Signed)
  Care Coordination   Initial Visit Note   08/12/2022 Name: Nichole Harris MRN: [id number] DOB: [birth year]  Nichole Harris is a 66 y.o. year old female who sees Nichole Dials, MD for primary care. I spoke with  Nichole Harris by phone today.  What matters to the patients health and wellness today?  No needs    Goals Addressed               This Visit's Progress     COMPLETED: No needs at this time (pt-stated)        Care Coordination Interventions: Reviewed medications with patient and discussed adherence with no needed refills Reviewed scheduled/upcoming provider appointments including pending appointments with sufficient transportation source Assessed social determinant of health barriers         SDOH assessments and interventions completed:  Yes  SDOH Interventions Today    Flowsheet Row Most Recent Value  SDOH Interventions   Food Insecurity Interventions Intervention Not Indicated  Housing Interventions Intervention Not Indicated  Transportation Interventions Intervention Not Indicated        Care Coordination Interventions Activated:  Yes  Care Coordination Interventions:  Yes, provided   Follow up plan: No further intervention required.   Encounter Outcome:  Pt. Visit Completed   Raina Mina, RN Care Management Coordinator [place] Office [phone]

## 2022-08-12 NOTE — Patient Instructions (Signed)
Visit Information  Thank you for taking time to visit with me today. Please don't hesitate to contact me if I can be of assistance to you.   Following are the goals we discussed today:   Goals Addressed               This Visit's Progress     COMPLETED: No needs at this time (pt-stated)        Care Coordination Interventions: Reviewed medications with patient and discussed adherence with no needed refills Reviewed scheduled/upcoming provider appointments including pending appointments with sufficient transportation source Assessed social determinant of health barriers         Please call the care guide team at [phone] if you need to cancel or reschedule your appointment.   If you are experiencing a Mental Health or [place] or need someone to talk to, please call the Suicide and Crisis Lifeline: 988  Patient verbalizes understanding of instructions and care plan provided today and agrees to view in [place]. Active MyChart status and patient understanding of how to access instructions and care plan via MyChart confirmed with patient.     No further follow up required: No needs    Raina Mina, RN Care Management Coordinator [place] Office [phone]

## 2023-07-05 NOTE — Progress Notes (Unsigned)
New Patient Note  RE: Nichole Harris MRN: [id number] DOB: Jun 02, 1956 Date of Office Visit: 07/06/2023  Consult requested by: Ollen Gross, MD Primary care provider: Henrine Screws, MD  Chief Complaint: No chief complaint on file.  History of Present Illness: I had the pleasure of seeing Nichole Harris for initial evaluation at the [facility] on 07/05/2023. She is a 67 y.o. female, who is referred here by Henrine Screws, MD for the evaluation of ***.  Discussed the use of AI scribe software for clinical note transcription with the patient, who gave verbal consent to proceed.  History of Present Illness             Patient is scheduled for *** replacement on ***. Patient had issues with ***.  No issues with watches, rings, bracelets, necklaces, belts, buttons on pants.   Prior joint replacements or metals in the body: ***.  Assessment and Plan: Nichole Harris is a 67 y.o. female with: ***  Assessment and Plan               No follow-ups on file.  No orders of the defined types were placed in this encounter.  Lab Orders  No laboratory test(s) ordered today    Other allergy screening: Asthma: {Blank single:19197::"yes","no"} Rhino conjunctivitis: {Blank single:19197::"yes","no"} Food allergy: {Blank single:19197::"yes","no"} Medication allergy: {Blank single:19197::"yes","no"} Hymenoptera allergy: {Blank single:19197::"yes","no"} Urticaria: {Blank single:19197::"yes","no"} Eczema:{Blank single:19197::"yes","no"} History of recurrent infections suggestive of immunodeficency: {Blank single:19197::"yes","no"}  Diagnostics: Spirometry:  Tracings reviewed. Her effort: {Blank single:19197::"Good reproducible efforts.","It was hard to get consistent efforts and there is a question as to whether this reflects a maximal maneuver.","Poor effort, data can not be interpreted."} FVC: ***L FEV1: ***L, ***% predicted FEV1/FVC ratio:  ***% Interpretation: {Blank single:19197::"Spirometry consistent with mild obstructive disease","Spirometry consistent with moderate obstructive disease","Spirometry consistent with severe obstructive disease","Spirometry consistent with possible restrictive disease","Spirometry consistent with mixed obstructive and restrictive disease","Spirometry uninterpretable due to technique","Spirometry consistent with normal pattern","No overt abnormalities noted given today's efforts"}.  Please see scanned spirometry results for details.  Skin Testing: {Blank single:19197::"Select foods","Environmental allergy panel","Environmental allergy panel and select foods","Food allergy panel","None","Deferred due to recent antihistamines use"}. *** Results discussed with patient/family.   Past Medical History: Patient Active Problem List   Diagnosis Date Noted  . Type 2 diabetes mellitus with obesity (HCC) 07/03/2019  . Hyperlipidemia due to type 2 diabetes mellitus ([facility]) 07/03/2019  . Family history of heart disease 07/03/2019  . Metabolic syndrome 07/03/2019  . Preop cardiovascular exam 01/03/2014  . Diabetes mellitus without complication ([facility])   . Hypertension   . Reflux   . Diverticulitis    Past Medical History:  Diagnosis Date  . Anemia   . Diabetes mellitus without complication ([facility])   . Diverticulitis   . Hypertension   . Reflux   . Uterine fibroids affecting pregnancy    Past Surgical History: Past Surgical History:  Procedure Laterality Date  . HAMMER TOE SURGERY    . lumpectomy    . TONSILLECTOMY    . UFE     Medication List:  Current Outpatient Medications  Medication Sig Dispense Refill  . atorvastatin (LIPITOR) 20 MG tablet Take 1 tablet (20 mg total) by mouth daily. 30 tablet 11  . glipiZIDE (GLUCOTROL XL) 5 MG 24 hr tablet Take 1 tablet by mouth daily.    Marland Kitchen losartan (COZAAR) 25 MG tablet Take 25 mg by mouth daily.    . Multiple Vitamins-Minerals (MULTIVITAMIN ADULT PO)  Take by mouth.    Marland Kitchen  rosuvastatin (CRESTOR) 5 MG tablet Take by mouth.     No current facility-administered medications for this visit.   Allergies: No Known Allergies Social History: Social History   Socioeconomic History  . Marital status: Single    Spouse name: Not on file  . Number of children: Not on file  . Years of education: Not on file  . Highest education level: Not on file  Occupational History  . Not on file  Tobacco Use  . Smoking status: Never  . Smokeless tobacco: Never  Vaping Use  . Vaping status: Not on file  Substance and Sexual Activity  . Alcohol use: No  . Drug use: No  . Sexual activity: Yes    Birth control/protection: None  Other Topics Concern  . Not on file  Social History Narrative  . Not on file   Social Determinants of Health   Financial Resource Strain: Not on file  Food Insecurity: No Food Insecurity (08/12/2022)   Hunger Vital Sign   . Worried About Programme researcher, broadcasting/film/video in the Last Year: Never true   . Ran Out of Food in the Last Year: Never true  Transportation Needs: No Transportation Needs (08/12/2022)   PRAPARE - Transportation   . Lack of Transportation (Medical): No   . Lack of Transportation (Non-Medical): No  Physical Activity: Not on file  Stress: Not on file  Social Connections: Unknown (01/24/2022)   [facility]   Social Network   . Social Network: Not on file   Lives in a ***. Smoking: *** Occupation: ***  Environmental HistorySurveyor, minerals in the house: Copywriter, advertising in the family room: {Blank single:19197::"yes","no"} Carpet in the bedroom: {Blank single:19197::"yes","no"} Heating: {Blank single:19197::"electric","gas","heat pump"} Cooling: {Blank single:19197::"central","window","heat pump"} Pet: {Blank single:19197::"yes ***","no"}  Family History: Family History  Problem Relation Age of Onset  . AAA (abdominal aortic aneurysm) Mother   . AAA  (abdominal aortic aneurysm) Father   . Heart disease Father    Problem                               Relation Asthma                                   *** Eczema                                *** Food allergy                          *** Allergic rhino conjunctivitis     ***  Review of Systems  Constitutional:  Negative for appetite change, chills, fever and unexpected weight change.  HENT:  Negative for congestion and rhinorrhea.   Eyes:  Negative for itching.  Respiratory:  Negative for cough, chest tightness, shortness of breath and wheezing.   Cardiovascular:  Negative for chest pain.  Gastrointestinal:  Negative for abdominal pain.  Genitourinary:  Negative for difficulty urinating.  Skin:  Negative for rash.  Neurological:  Negative for headaches.   Objective: There were no vitals taken for this visit. There is no height or weight on file to calculate BMI. Physical Exam Vitals and nursing note reviewed.  Constitutional:      Appearance: Normal appearance. She  is well-developed.  HENT:     Head: Normocephalic and atraumatic.     Right Ear: Tympanic membrane and external ear normal.     Left Ear: Tympanic membrane and external ear normal.     Nose: Nose normal.     Mouth/Throat:     Mouth: Mucous membranes are moist.     Pharynx: Oropharynx is clear.  Eyes:     Conjunctiva/sclera: Conjunctivae normal.  Cardiovascular:     Rate and Rhythm: Normal rate and regular rhythm.     Heart sounds: Normal heart sounds. No murmur heard.    No friction rub. No gallop.  Pulmonary:     Effort: Pulmonary effort is normal.     Breath sounds: Normal breath sounds. No wheezing, rhonchi or rales.  Musculoskeletal:     Cervical back: Neck supple.  Skin:    General: Skin is warm.     Findings: No rash.  Neurological:     Mental Status: She is alert and oriented to person, place, and time.  Psychiatric:        Behavior: Behavior normal.  The plan was reviewed with the  patient/family, and all questions/concerned were addressed.  It was my pleasure to see Nichole Harris today and participate in her care. Please feel free to contact me with any questions or concerns.  Sincerely,  Wyline Mood, DO Allergy & Immunology  [facility] office: [phone] [facility] office: [phone]

## 2023-07-06 ENCOUNTER — Encounter: Payer: Self-pay | Admitting: Allergy

## 2023-07-06 ENCOUNTER — Ambulatory Visit: Payer: Medicare Other | Admitting: Allergy

## 2023-07-06 VITALS — BP 130/72 | HR 88 | Temp 97.6°F | Resp 18 | Ht 70.67 in | Wt 264.0 lb

## 2023-07-06 DIAGNOSIS — R21 Rash and other nonspecific skin eruption: Secondary | ICD-10-CM

## 2023-07-06 DIAGNOSIS — L2389 Allergic contact dermatitis due to other agents: Secondary | ICD-10-CM

## 2023-07-06 DIAGNOSIS — J310 Chronic rhinitis: Secondary | ICD-10-CM

## 2023-07-06 NOTE — Patient Instructions (Addendum)
Nickel  Discussed with patient that patch testing tests for contact dermatitis and sometimes it does not correlate to how one will react to metals in the body.   However given your clinical history - no issues with costume jewelry, no issues with the pins in your foot. I don't think you are allergic to nickel. There is no indication for any patch testing today.  I'll send a note to Dr. Lequita Halt as well.  Rash on neck Langston Reusing, DO, [facility] [phone]  Sin[facility] Return for environmental allergy testing if it worsens. Okay to take Claritin as needed.  Follow up as needed.
